# Patient Record
Sex: Female | Born: 1935 | Race: White | Hispanic: No | Marital: Married | Smoking: Never smoker
Health system: Southern US, Community
[De-identification: ages and names within clinical notes are randomized; demographics above are authoritative.]

## PROBLEM LIST (undated history)

## (undated) DIAGNOSIS — I10 Essential (primary) hypertension: Secondary | ICD-10-CM

## (undated) DIAGNOSIS — M5416 Radiculopathy, lumbar region: Secondary | ICD-10-CM

## (undated) DIAGNOSIS — F419 Anxiety disorder, unspecified: Secondary | ICD-10-CM

## (undated) DIAGNOSIS — E785 Hyperlipidemia, unspecified: Secondary | ICD-10-CM

## (undated) DIAGNOSIS — G2581 Restless legs syndrome: Secondary | ICD-10-CM

## (undated) DIAGNOSIS — G20 Parkinson's disease: Secondary | ICD-10-CM

## (undated) DIAGNOSIS — G894 Chronic pain syndrome: Secondary | ICD-10-CM

## (undated) DIAGNOSIS — G20A1 Parkinson's disease without dyskinesia, without mention of fluctuations: Secondary | ICD-10-CM

## (undated) DIAGNOSIS — N189 Chronic kidney disease, unspecified: Secondary | ICD-10-CM

## (undated) DIAGNOSIS — E119 Type 2 diabetes mellitus without complications: Secondary | ICD-10-CM

## (undated) DIAGNOSIS — I351 Nonrheumatic aortic (valve) insufficiency: Secondary | ICD-10-CM

## (undated) DIAGNOSIS — I639 Cerebral infarction, unspecified: Secondary | ICD-10-CM

## (undated) HISTORY — PX: ANTERIOR AND POSTERIOR REPAIR: SHX1172

## (undated) HISTORY — DX: Restless legs syndrome: G25.81

## (undated) HISTORY — DX: Radiculopathy, lumbar region: M54.16

## (undated) HISTORY — DX: Type 2 diabetes mellitus without complications: E11.9

## (undated) HISTORY — DX: Parkinson's disease: G20

## (undated) HISTORY — DX: Chronic kidney disease, unspecified: N18.9

## (undated) HISTORY — DX: Anxiety disorder, unspecified: F41.9

## (undated) HISTORY — DX: Nonrheumatic aortic (valve) insufficiency: I35.1

## (undated) HISTORY — DX: Hyperlipidemia, unspecified: E78.5

## (undated) HISTORY — PX: TUBAL LIGATION: SHX77

## (undated) HISTORY — DX: Chronic pain syndrome: G89.4

## (undated) HISTORY — DX: Parkinson's disease without dyskinesia, without mention of fluctuations: G20.A1

## (undated) HISTORY — DX: Essential (primary) hypertension: I10

## (undated) HISTORY — PX: UROFLOWMETRY SIMPLE / COMPLEX: SUR1429

## (undated) HISTORY — PX: CHOLECYSTECTOMY: SHX55

## (undated) HISTORY — DX: Cerebral infarction, unspecified: I63.9

## (undated) HISTORY — PX: BLADDER SURGERY: SHX569

## (undated) HISTORY — PX: OVARIAN CYST REMOVAL: SHX89

---

## 1999-09-17 ENCOUNTER — Encounter: Payer: Self-pay | Admitting: Urology

## 1999-09-26 ENCOUNTER — Ambulatory Visit (HOSPITAL_COMMUNITY): Admission: RE | Admit: 1999-09-26 | Discharge: 1999-09-26 | Payer: Self-pay | Admitting: Urology

## 2003-06-26 ENCOUNTER — Ambulatory Visit (HOSPITAL_BASED_OUTPATIENT_CLINIC_OR_DEPARTMENT_OTHER): Admission: RE | Admit: 2003-06-26 | Discharge: 2003-06-26 | Payer: Self-pay | Admitting: Urology

## 2004-05-06 ENCOUNTER — Observation Stay (HOSPITAL_COMMUNITY): Admission: RE | Admit: 2004-05-06 | Discharge: 2004-05-07 | Payer: Self-pay | Admitting: Urology

## 2005-06-19 ENCOUNTER — Ambulatory Visit (HOSPITAL_COMMUNITY): Admission: RE | Admit: 2005-06-19 | Discharge: 2005-06-19 | Payer: Self-pay | Admitting: Urology

## 2005-06-19 ENCOUNTER — Ambulatory Visit (HOSPITAL_BASED_OUTPATIENT_CLINIC_OR_DEPARTMENT_OTHER): Admission: RE | Admit: 2005-06-19 | Discharge: 2005-06-19 | Payer: Self-pay | Admitting: Urology

## 2005-10-19 ENCOUNTER — Ambulatory Visit (HOSPITAL_COMMUNITY): Admission: RE | Admit: 2005-10-19 | Discharge: 2005-10-19 | Payer: Self-pay | Admitting: Urology

## 2007-03-01 ENCOUNTER — Inpatient Hospital Stay (HOSPITAL_COMMUNITY): Admission: RE | Admit: 2007-03-01 | Discharge: 2007-03-07 | Payer: Self-pay | Admitting: Urology

## 2007-03-09 ENCOUNTER — Inpatient Hospital Stay (HOSPITAL_COMMUNITY): Admission: EM | Admit: 2007-03-09 | Discharge: 2007-03-25 | Payer: Self-pay | Admitting: Emergency Medicine

## 2008-10-05 IMAGING — CR DG CHEST 2V
2 series · 2 of 2 positions shown · non-contrast
Comparison: 05/05/04.

CLINICAL DATA: 71 year old; end-stage interstitial cystitis.  Preoperative respiratory exam. 
 CHEST - 2 VIEW - 02/24/07:

[view not recorded (1 of 2)]
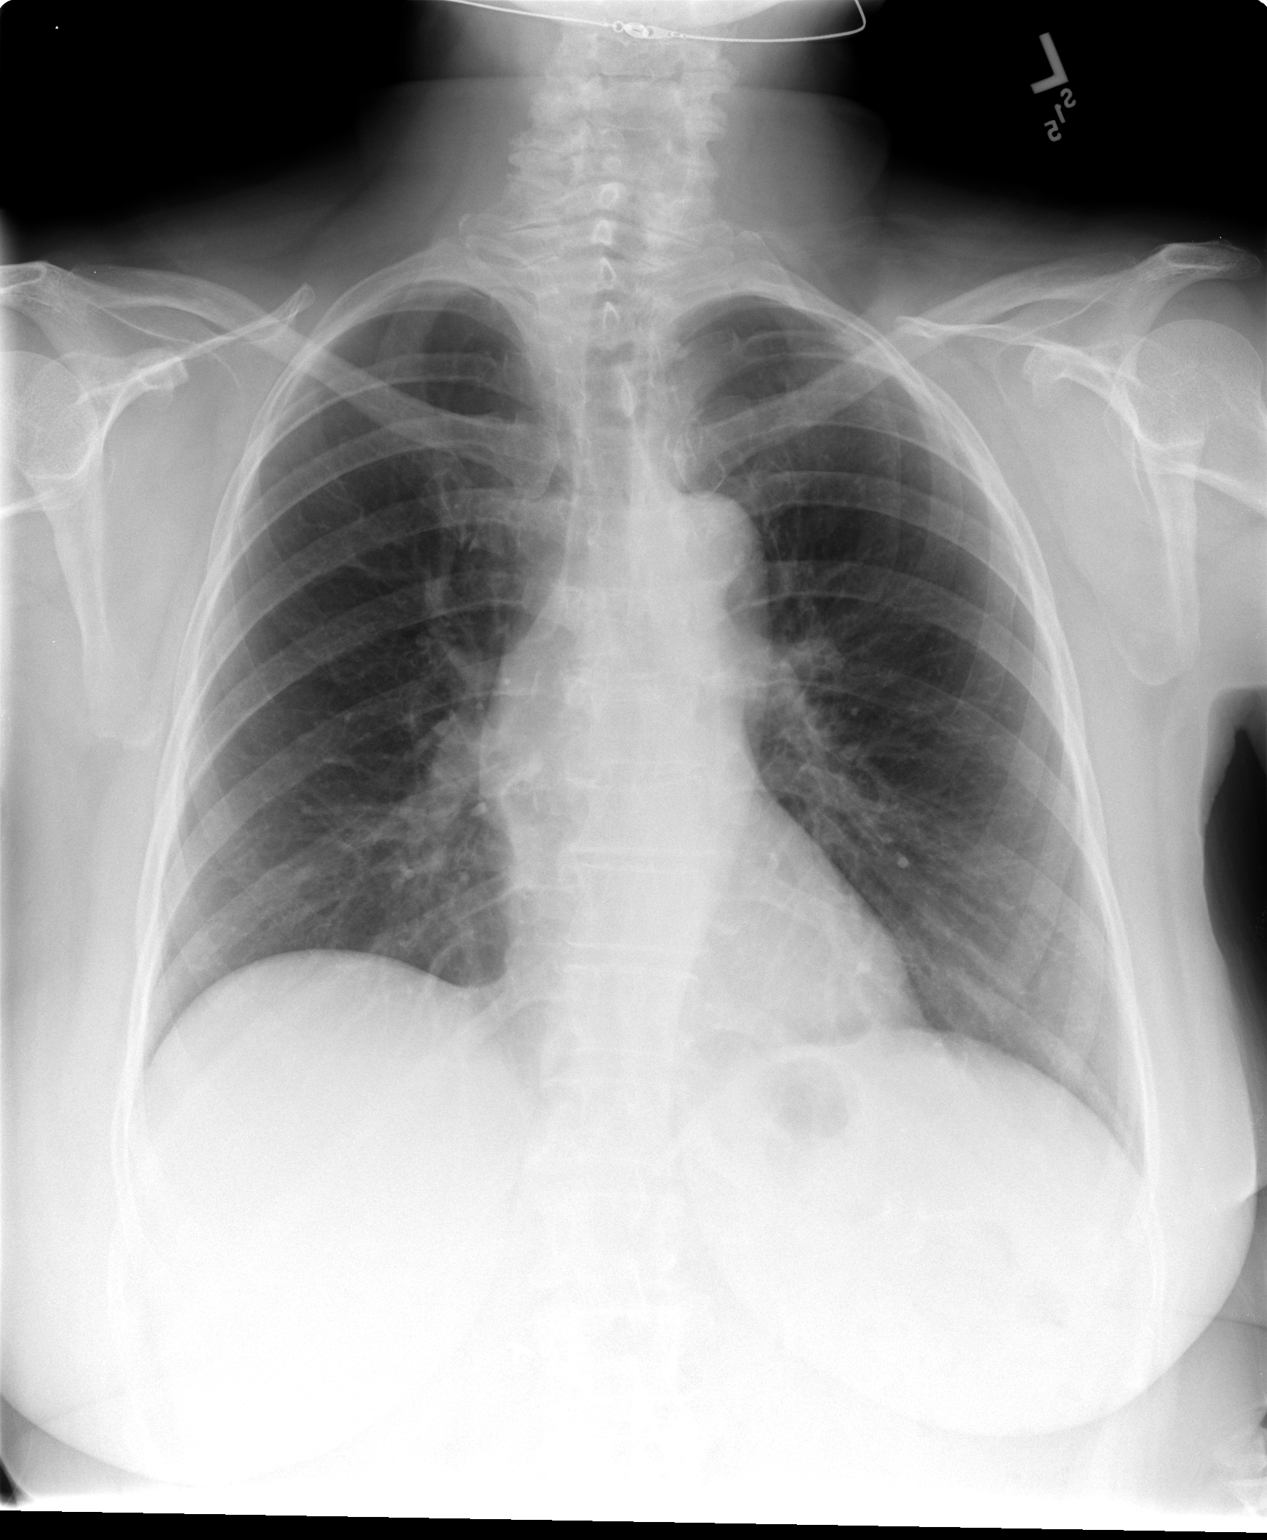

[view not recorded (2 of 2)]
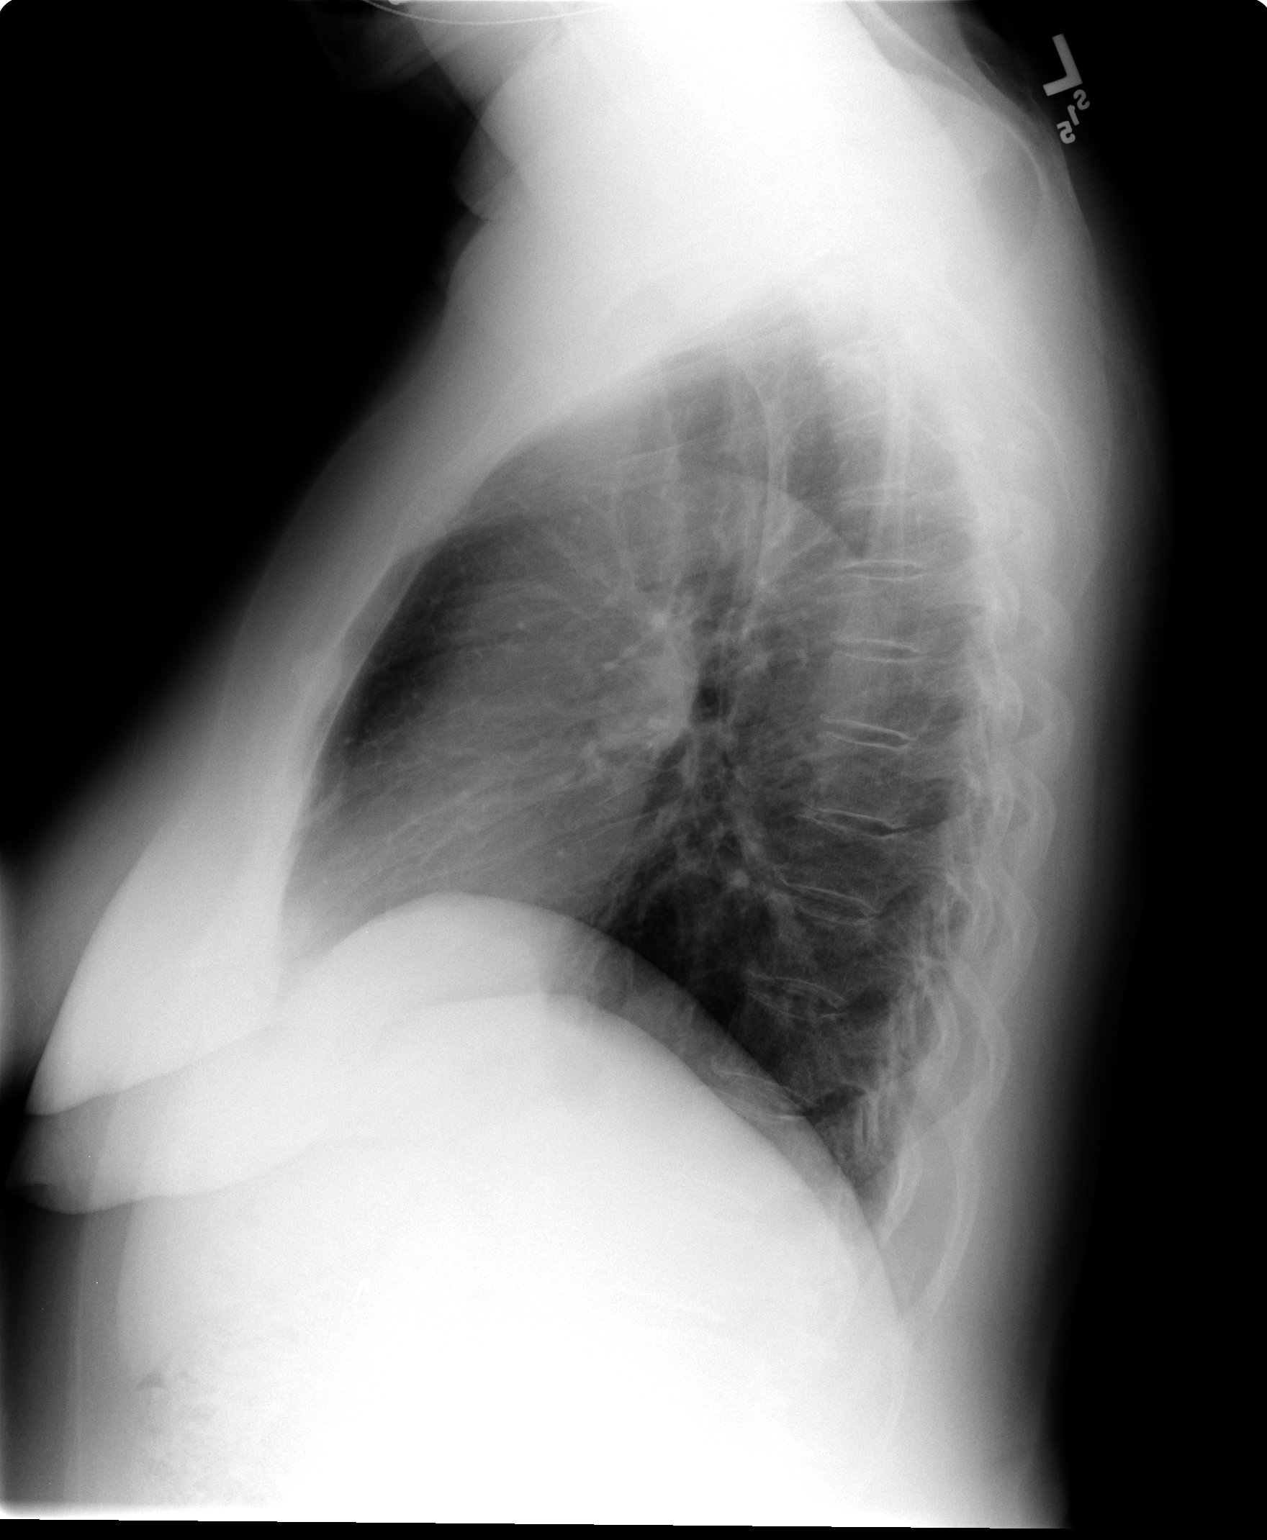

[2 of 2 positions shown; findings below may reference images not displayed]

FINDINGS: The cardiac silhouette, mediastinal and hilar contours are within normal limits and stable.  There is a slightly prominent right hilum that I think is due to tortuosity of the ascending aorta and also SVC.  The lungs are clear.   The bony structures are intact.
IMPRESSION: No acute cardiopulmonary findings.   Stable appearance of the chest since 05/05/04.

## 2011-01-17 ENCOUNTER — Encounter: Payer: Self-pay | Admitting: Urology

## 2017-09-03 ENCOUNTER — Encounter: Payer: Self-pay | Admitting: Neurology

## 2017-09-22 ENCOUNTER — Encounter: Payer: Self-pay | Admitting: Neurology

## 2017-09-24 NOTE — Progress Notes (Signed)
Claudia Sanders was seen today in the movement disorders clinic for neurologic consultation at the request of Myrtis Hopping., MD.  The consultation is for the evaluation of PD.  The records that were made available to me were reviewed.  She has been taken care of neurologically by Dr. Barnett Hatter in [facility].  She was last seen on 08/31/17.  At that visit her pramipexole 7m tid was d/c and she was started on carbidopa/levodopa 25/100 tid.  She ended up seeing her PCP and told him she wanted to d/c the carbidopa/levodopa and restart pramipexole.   She is still on carbidopa/levodopa 25/100 tid.  She states that she heard that the pramipexole and carbidopa/levodopa 25/100 work best together and she really would like to consider this.     Most of patients neuro records are focused on back pain, ESI and not on PD management.  Pt is unsure of when she was dx.  She initially states that she was dx 6 years ago and then states 2 years ago.   States that her 542y/o son was dx with PD and she noted that small handwriting was a sx.  She also noted drooling and balance issues.   Specific Symptoms:  Tremor: usually not Family hx of similar:  Yes, son with PD Voice: softer  Sleep: sleeps pretty well  Vivid Dreams:  No.  Acting out dreams:  Yes.  , just starting to fight with arms Wet Pillows: No. Postural symptoms:  Yes.    Falls?  Yes.   fx pelvis in 2017 after a fall Bradykinesia symptoms: difficulty getting out of a chair and drags R leg; drools some during the day Loss of smell:  Maybe a little Loss of taste:  No. Urinary Incontinence:  Has urostomy due to IC Difficulty Swallowing:  No. Handwriting, micrographia: Yes.   Trouble with ADL's:  No. (rare trouble with bra)  Trouble buttoning clothing: No. Depression:  No. Memory changes:  Yes.  , minor trouble with short term memory and some trouble with word finding; she drives minimally (one time in the last 2 years) Hallucinations:  No.  visual  distortions: No. N/V:  No. Lightheaded:  Yes.  , rarely  Syncope: No. Diplopia:  No., but has blurry vision even with new glasses Dyskinesia:  No.  Neuroimaging of the brain has previously been performed.  It is not available for my review today.  She was told that she previously had a stroke that she didn't know about  PREVIOUS MEDICATIONS: Sinemet and Mirapex  ALLERGIES:   Allergies  Allergen Reactions  . Amlodipine   . Buspar [Buspirone]   . Ciprofloxacin   . Estradiol   . Fosamax [Alendronate Sodium]   . Iodine   . Losartan   . Metoprolol   . Penicillins   . Rofecoxib   . Tobrex [Tobramycin]     CURRENT MEDICATIONS:  Outpatient Encounter Prescriptions as of 09/28/2017  Medication Sig  . ALPRAZolam (XANAX) 1 MG tablet Take 1 mg by mouth 2 (two) times daily as needed for anxiety.   .Marland Kitchenatenolol (TENORMIN) 50 MG tablet Take 50 mg by mouth daily.  . carbidopa-levodopa (SINEMET IR) 25-100 MG tablet Take 1 tablet by mouth 3 (three) times daily.  . cholecalciferol (VITAMIN D) 1000 units tablet Take 1,000 Units by mouth daily.  . clobetasol cream (TEMOVATE) 05.68% Apply 1 application topically 2 (two) times daily.  . Cranberry 500 MG CAPS Take by mouth.  .Marland Kitchen  erythromycin ophthalmic ointment 1 application at bedtime.  . furosemide (LASIX) 20 MG tablet Take 20 mg by mouth as needed.  . gabapentin (NEURONTIN) 100 MG capsule Take 400 mg by mouth at bedtime.   Marland Kitchen glucosamine-chondroitin 500-400 MG tablet Take 1 tablet by mouth 3 (three) times daily.  . hydrocortisone 2.5 % cream Apply topically 2 (two) times daily.  Marland Kitchen ketoconazole (NIZORAL) 2 % shampoo Apply 1 application topically 2 (two) times a week.  Marland Kitchen LACTOBACILLUS PO Take by mouth.  . Sodium Fluoride (PREVIDENT 5000 BOOSTER) 1.1 % PSTE Place onto teeth.   No facility-administered encounter medications on file as of 09/28/2017.     PAST MEDICAL HISTORY:   Past Medical History:  Diagnosis Date  . Anxiety   . Aortic valve  insufficiency   . Benign essential hypertension   . Chronic kidney disease   . Chronic pain syndrome   . CVA (cerebral vascular accident) ([place])   . Hyperlipidemia   . Lumbar radiculopathy   . Parkinson's disease ([place])   . RLS (restless legs syndrome)   . Type 2 diabetes mellitus ([place])     PAST SURGICAL HISTORY:   Past Surgical History:  Procedure Laterality Date  . ANTERIOR AND POSTERIOR REPAIR    . CHOLECYSTECTOMY    . OVARIAN CYST REMOVAL    . TUBAL LIGATION    . UROFLOWMETRY SIMPLE / COMPLEX      SOCIAL HISTORY:   Social History   Social History  . Marital status: Married    Spouse name: N/A  . Number of children: N/A  . Years of education: N/A   Occupational History  . Not on file.   Social History Main Topics  . Smoking status: Never Smoker  . Smokeless tobacco: Never Used  . Alcohol use No  . Drug use: No  . Sexual activity: Not on file   Other Topics Concern  . Not on file   Social History Narrative  . No narrative on file    FAMILY HISTORY:   Family Status  Relation Status  . Mother Deceased  . Father Deceased  . Brother Alive  . Brother Alive  . Brother Deceased  . Son Alive    ROS:  A complete 10 system review of systems was obtained and was unremarkable apart from what is mentioned above.  PHYSICAL EXAMINATION:    VITALS:   Vitals:   09/28/17 0931  BP: 128/82  Pulse: 66  SpO2: 96%  Weight: 131 lb (59.4 kg)  Height: 4' 10"  (1.473 m)    GEN:  The patient appears stated age and is in NAD. HEENT:  Normocephalic, atraumatic.  The mucous membranes are moist. The superficial temporal arteries are without ropiness or tenderness. CV:  RRR Lungs:  CTAB Neck/HEME:  There are no carotid bruits bilaterally.  Neurological examination:  Orientation: The patient is alert and oriented x3. Fund of knowledge is appropriate.  Recent and remote memory are intact.  Attention and concentration are normal.    Able to name objects and repeat  phrases. Cranial nerves: There is good facial symmetry.  There is facial hypomimia.  Pupils are equal round and reactive to light bilaterally. Fundoscopic exam reveals clear margins bilaterally. Extraocular muscles are intact. She has mild trouble with upgaze.  She has occasional square wave jerks.  The visual fields are full to confrontational testing. The speech is fluent and clear.  She is hypophonic.  Soft palate rises symmetrically and there is no tongue deviation. Hearing  is intact to conversational tone. Sensation: Sensation is intact to light and pinprick throughout (facial, trunk, extremities). Vibration is intact at the bilateral big toe but slightly decreased. There is no extinction with double simultaneous stimulation. There is no sensory dermatomal level identified. Motor: Strength is 5/5 in the bilateral upper and lower extremities.   Shoulder shrug is equal and symmetric.  There is no pronator drift. Deep tendon reflexes: Deep tendon reflexes are 2/4 at the bilateral biceps, triceps, brachioradialis, 2+ at the bilateral patella and achilles. Plantar responses are downgoing bilaterally.  Movement examination: Tone: There is mild to mod increased tone in the RUE.  There is mild increased tone in the RLE.  There is normal tone in the LUE/LLE.   Abnormal movements: none even with distraction Coordination:  There is decremation with RAM's, with any form of RAMS, including alternating supination and pronation of the forearm, hand opening and closing, finger taps, heel taps and toe taps, R more than L Gait and Station: The patient has no difficulty arising out of a deep-seated chair without the use of the hands. The patient's stride length is slightly decreased with poor arm swing bilaterally.  The patient has a positive pull test.      ASSESSMENT/PLAN:  1.  Parkinsonism  -This is likely idiopathic Parkinson's disease, although I do not think that PSP can completely be excluded.  She was just  started on levodopa and finds this to be at least 25% beneficial.  I am going to increase the dosage to carbidopa/levodopa 25/100, 1.5 tablets 3 times per day.  We discussed potential interactions with protein.  Risks, benefits, side effects and alternative therapies were discussed.  The opportunity to ask questions was given and they were answered to the best of my ability.  The patient expressed understanding and willingness to follow the outlined treatment protocols.  -wants to be on pramipexole but I would not recommend given hx of dialysis in the past and chronic renal insufficiency.  This is also risky given age and I would not add this. Parkinsonism.  I suspect that this does represent idiopathic Parkinson's disease.  The patient has tremor, bradykinesia, rigidity and mild postural instability.  -We discussed the diagnosis as well as pathophysiology of the disease.  We discussed treatment options as well as prognostic indicators.  Patient education was provided.  -We discussed that it used to be thought that levodopa would increase risk of melanoma but now it is believed that Parkinsons itself likely increases risk of melanoma. she is to get regular skin checks.  -We discussed community resources in the area including patient support groups and community exercise programs for PD and pt education was provided to the patient.  -She met with our Parkinson's social worker today and was encouraged to utilize that resource.  2.  Sialorrhea  -This is commonly associated with PD.  We talked about treatments.  The patient is not a candidate for oral anticholinergic therapy because of increased risk of confusion and falls.  We discussed myobloc and 1% atropine drops.  We discusssed that candy like lemon drops (sugar free) can help by stimulating mm of the oropharynx to induce swallowing.  She is going to think about the options.    3.  Chronic low back pain  -She has been on pain medication and continues to  be on pain medication.  This was previously followed by her neurologist and was receiving epidural steroid injections.  This is not something that our office provides.  4.  She will follow-up with me in the next 3-4 months, sooner should new neurologic issues arise.   Much greater than 50% of this visit was spent in counseling and coordinating care.  Total face to face time:  60 min   Cc:  Myrtis Hopping., MD

## 2017-09-28 ENCOUNTER — Encounter: Payer: Self-pay | Admitting: Psychology

## 2017-09-28 ENCOUNTER — Encounter: Payer: Self-pay | Admitting: Neurology

## 2017-09-28 ENCOUNTER — Ambulatory Visit (INDEPENDENT_AMBULATORY_CARE_PROVIDER_SITE_OTHER): Payer: Medicare Other | Admitting: Neurology

## 2017-09-28 VITALS — BP 128/82 | HR 66 | Ht <= 58 in | Wt 131.0 lb

## 2017-09-28 DIAGNOSIS — K117 Disturbances of salivary secretion: Secondary | ICD-10-CM

## 2017-09-28 DIAGNOSIS — G20 Parkinson's disease: Secondary | ICD-10-CM | POA: Diagnosis not present

## 2017-09-28 DIAGNOSIS — G8929 Other chronic pain: Secondary | ICD-10-CM

## 2017-09-28 DIAGNOSIS — M545 Low back pain: Secondary | ICD-10-CM | POA: Diagnosis not present

## 2017-09-28 MED ORDER — CARBIDOPA-LEVODOPA 25-100 MG PO TABS: 1.5000 | ORAL_TABLET | Freq: Three times a day (TID) | ORAL | 1 refills | Status: DC

## 2017-09-28 NOTE — Progress Notes (Signed)
I met with Claudia Sanders and her husband while they were in clinic today. After their visit with Dr. Carles Collet I provided information on local resources for patients living with Parkinson's disease including exercise, wellness and community support programs in addition to my contact information.

## 2017-09-28 NOTE — Patient Instructions (Signed)
1. Increase Levodopa to 1.5 tablets three times daily.

## 2018-02-01 NOTE — Progress Notes (Signed)
Claudia Sanders was seen today in the movement disorders clinic for neurologic consultation at the request of Dan Maker., MD.  The consultation is for the evaluation of PD.  The records that were made available to me were reviewed.  She has been taken care of neurologically by Dr. Rosario Jacks in [facility].  She was last seen on 08/31/17.  At that visit her pramipexole 1mg  tid was d/c and she was started on carbidopa/levodopa 25/100 tid.  She ended up seeing her PCP and told him she wanted to d/c the carbidopa/levodopa and restart pramipexole.   She is still on carbidopa/levodopa 25/100 tid.  She states that she heard that the pramipexole and carbidopa/levodopa 25/100 work best together and she really would like to consider this.     Most of patients neuro records are focused on back pain, ESI and not on PD management.  Pt is unsure of when she was dx.  She initially states that she was dx 6 years ago and then states 2 years ago.   States that her 45 y/o son was dx with PD and she noted that small handwriting was a sx.  She also noted drooling and balance issues.   Neuroimaging of the brain has previously been performed.  It is not available for my review today.  She was told that she previously had a stroke that she didn't know about  02/03/18 update: Patient is seen today in follow-up. This patient is accompanied in the office by her spouse who supplements the history.  her levodopa was increased last visit so that she is on carbidopa/levodopa 25/100, 1.5 tablets 3 times per day.   Her only c/o inner tremor.  States that it may be when wearing off.  Will take 1/2 xanax to help.  Does this bid.   Has some c/o that R leg draws and toes may cramp.  Her husband reports that she has always had RLS.  Pt denies falls.  Notes that much better since riding bicycle and people comment on how well she looks.  Riding recumbant bike at home.  Stopped taking gabapentin due to vivid dreams.  back pain is worse and  asks if she should restart.  Having fatigue and doesn't think med related.  She takes a lengthy nap during the day and then gets up with trouble sleeping at night.  Pt denies lightheadedness, near syncope.  No hallucinations.  Mood has been good.  The records that were made available to me were reviewed via care everywhere.  PREVIOUS MEDICATIONS: Sinemet and Mirapex  ALLERGIES:   Allergies  Allergen Reactions  . Amlodipine   . Buspar [Buspirone]   . Ciprofloxacin   . Estradiol   . Fosamax [Alendronate Sodium]   . Iodine   . Losartan   . Metoprolol   . Penicillins   . Rofecoxib   . Tobrex [Tobramycin]     CURRENT MEDICATIONS:  Outpatient Encounter Medications as of 02/03/2018  Medication Sig  . ALPRAZolam (XANAX) 1 MG tablet Take 1 mg by mouth 2 (two) times daily as needed for anxiety.   Marland Kitchen atenolol (TENORMIN) 50 MG tablet Take 50 mg by mouth daily.  . carbidopa-levodopa (SINEMET IR) 25-100 MG tablet Take 1.5 tablets by mouth 3 (three) times daily.  . cholecalciferol (VITAMIN D) 1000 units tablet Take 1,000 Units by mouth daily.  . clobetasol cream (TEMOVATE) 0.05 % Apply 1 application topically 2 (two) times daily.  . Cranberry 500 MG CAPS  Take by mouth.  . erythromycin ophthalmic ointment 1 application at bedtime.  . furosemide (LASIX) 20 MG tablet Take 20 mg by mouth as needed.  . gabapentin (NEURONTIN) 100 MG capsule Take 400 mg by mouth at bedtime.   Marland Kitchen glucosamine-chondroitin 500-400 MG tablet Take 1 tablet by mouth 3 (three) times daily.  . hydrocortisone 2.5 % cream Apply topically 2 (two) times daily.  Marland Kitchen ketoconazole (NIZORAL) 2 % shampoo Apply 1 application topically 2 (two) times a week.  Marland Kitchen LACTOBACILLUS PO Take by mouth.  . Sodium Fluoride (PREVIDENT 5000 BOOSTER) 1.1 % PSTE Place onto teeth.   No facility-administered encounter medications on file as of 02/03/2018.     PAST MEDICAL HISTORY:   Past Medical History:  Diagnosis Date  . Anxiety   . Aortic valve  insufficiency   . Benign essential hypertension   . Chronic kidney disease   . Chronic pain syndrome    chronic back pain  . CVA (cerebral vascular accident) ([facility])   . Hyperlipidemia   . Lumbar radiculopathy   . Parkinson's disease ([facility])   . RLS (restless legs syndrome)   . Type 2 diabetes mellitus ([facility])     PAST SURGICAL HISTORY:   Past Surgical History:  Procedure Laterality Date  . ANTERIOR AND POSTERIOR REPAIR    . BLADDER SURGERY     urostomy  . CHOLECYSTECTOMY    . OVARIAN CYST REMOVAL    . TUBAL LIGATION    . UROFLOWMETRY SIMPLE / COMPLEX      SOCIAL HISTORY:   Social History   Socioeconomic History  . Marital status: Married    Spouse name: Not on file  . Number of children: Not on file  . Years of education: Not on file  . Highest education level: Not on file  Social Needs  . Financial resource strain: Not on file  . Food insecurity - worry: Not on file  . Food insecurity - inability: Not on file  . Transportation needs - medical: Not on file  . Transportation needs - non-medical: Not on file  Occupational History  . Occupation: retired    Comment: Engineer, civil (consulting)  Tobacco Use  . Smoking status: Never Smoker  . Smokeless tobacco: Never Used  Substance and Sexual Activity  . Alcohol use: No  . Drug use: No  . Sexual activity: Not on file  Other Topics Concern  . Not on file  Social History Narrative  . Not on file    FAMILY HISTORY:   Family Status  Relation Name Status  . Mother  Deceased  . Father  Deceased  . Brother  Alive  . Brother  Alive  . Son  Alive  . Brother  Alive  . Son  Alive  . Son  Alive    ROS:  A complete 10 system review of systems was obtained and was unremarkable apart from what is mentioned above.  PHYSICAL EXAMINATION:    VITALS:   There were no vitals filed for this visit.  GEN:  The patient appears stated age and is in NAD. HEENT:  Normocephalic, atraumatic.  The mucous membranes are moist. The superficial temporal  arteries are without ropiness or tenderness. CV:  RRR Lungs:  CTAB Neck/HEME:  There are no carotid bruits bilaterally.  Neurological examination:  Orientation: The patient is alert and oriented x3. Fund of knowledge is appropriate.  Recent and remote memory are intact.  Attention and concentration are normal.    Able to name objects  and repeat phrases. Cranial nerves: There is good facial symmetry.  There is facial hypomimia.  Pupils are equal round and reactive to light bilaterally. Fundoscopic exam reveals clear margins bilaterally. Extraocular muscles are intact. She has mild trouble with upgaze.  She has occasional square wave jerks.  The visual fields are full to confrontational testing. The speech is fluent and clear.  She is hypophonic.  Soft palate rises symmetrically and there is no tongue deviation. Hearing is intact to conversational tone. Sensation: Sensation is intact to light and pinprick throughout (facial, trunk, extremities). Vibration is intact at the bilateral big toe but slightly decreased. There is no extinction with double simultaneous stimulation. There is no sensory dermatomal level identified. Motor: Strength is 5/5 in the bilateral upper and lower extremities.   Shoulder shrug is equal and symmetric.  There is no pronator drift.  Movement examination: Tone: There is mild increased tone in the RUE and mild to mod in the RLE Coordination:  There is decremation with RAM's, with any form of RAMS, including alternating supination and pronation of the forearm, hand opening and closing, finger taps, heel taps and toe taps, R more than L (same as last visit) Gait and Station: The patient has no difficulty arising out of a deep-seated chair without the use of the hands. The patient's stride length is slightly decreased with poor arm swing bilaterally.  The patient has a positive pull test.      Labs:  Had labs on 02/01/18 and patient brought them today.  Her A1C is 6.2.  TSH is  3.394. Chemistry was normal as was CBC  ASSESSMENT/PLAN:  1.  Parkinsonism  -This is likely idiopathic Parkinson's disease, although I do not think that PSP can completely be excluded.  She was just started on levodopa and finds this to be at least 25% beneficial.  I am going to increase the dosage to carbidopa/levodopa 25/100, 2 po times per day.  If not helpful for rigidity may need to change to qid dosing.  - will try an extra 1/2 tablet dissolved in ginger ale when has inner tremor to see if helpful.  Try this instead of xanax.  If helpful, may need to adjust the dosing.  -wants to be on pramipexole but I would not recommend given hx of dialysis in the past and chronic renal insufficiency.  This is also risky given age and I would not add this.  -talked to her about regular daily schedule and napping only when scheduled, no longer than 60 min.    -We discussed the diagnosis as well as pathophysiology of the disease.  We discussed treatment options as well as prognostic indicators.  Patient education was provided.  -We discussed that it used to be thought that levodopa would increase risk of melanoma but now it is believed that Parkinsons itself likely increases risk of melanoma. she is to get regular skin checks.  -We discussed community resources in the area including patient support groups and community exercise programs for PD and pt education was provided to the patient.  2.  Sialorrhea  -This is commonly associated with PD.  We talked about treatments.  The patient is not a candidate for oral anticholinergic therapy because of increased risk of confusion and falls.  We discussed myobloc and 1% atropine drops.  We discusssed that candy like lemon drops (sugar free) can help by stimulating mm of the oropharynx to induce swallowing.  She is going to think about the options.  3.  Chronic low back pain  -She has been on pain medication and continues to be on pain medication.  This was  previously followed by her neurologist and was receiving epidural steroid injections.  This is not something that our office provides.  However, she did take herself off of gabapentin due to dreams and back pain is worse.  Explained that vivid dreams are part of PD and likely not related to gabapentin.  She wants to go back on it and I have no objection.  4.  chronic kidney disease  -has appointment tomorrow with echo and renal u/s due to hard to control HTN to r/o RAS  5.  Follow up is anticipated in the next few months, sooner should new neurologic issues arise.  Much greater than 50% of this visit was spent in counseling and coordinating care.  Total face to face time:  30 min   Cc:  Dan Maker., MD

## 2018-02-03 ENCOUNTER — Ambulatory Visit (INDEPENDENT_AMBULATORY_CARE_PROVIDER_SITE_OTHER): Payer: Medicare Other | Admitting: Neurology

## 2018-02-03 ENCOUNTER — Encounter: Payer: Self-pay | Admitting: Neurology

## 2018-02-03 ENCOUNTER — Encounter: Payer: Self-pay | Admitting: Psychology

## 2018-02-03 VITALS — BP 130/78 | HR 66 | Ht <= 58 in | Wt 130.0 lb

## 2018-02-03 DIAGNOSIS — G20 Parkinson's disease: Secondary | ICD-10-CM | POA: Diagnosis not present

## 2018-02-03 DIAGNOSIS — R5383 Other fatigue: Secondary | ICD-10-CM

## 2018-02-03 DIAGNOSIS — M545 Low back pain: Secondary | ICD-10-CM

## 2018-02-03 DIAGNOSIS — G8929 Other chronic pain: Secondary | ICD-10-CM | POA: Diagnosis not present

## 2018-02-03 NOTE — Patient Instructions (Signed)
Increase Carbidopa Levodopa to 2 tablets three times daily.

## 2018-02-03 NOTE — Progress Notes (Signed)
Met with the patient and her husband today while they were in the clinic.  They are very interested in having Parkinson's resources in the [place] community and would like to participate when the support group is open.  In addition they are very interested in exercise for Parkinson's in the [facility] area.  I would let them know as soon as the group has a finalized location.

## 2018-02-10 ENCOUNTER — Telehealth: Payer: Self-pay | Admitting: Neurology

## 2018-02-10 NOTE — Telephone Encounter (Signed)
Pt left a VM message regarding the increase of her carbadopa and would like a call back

## 2018-02-11 NOTE — Telephone Encounter (Signed)
Tried to call x2 and it states number is not in service. Will await call back.

## 2018-03-09 ENCOUNTER — Telehealth: Payer: Self-pay | Admitting: Neurology

## 2018-03-09 MED ORDER — CARBIDOPA-LEVODOPA 25-100 MG PO TABS: 1.5000 | ORAL_TABLET | Freq: Three times a day (TID) | ORAL | 1 refills | Status: DC

## 2018-03-09 NOTE — Telephone Encounter (Signed)
Increased dose sent to [facility].

## 2018-03-09 NOTE — Telephone Encounter (Signed)
Patient called regarding her Carbidopa Levodopa medication 1.5 3 x a day pill pack. She said an Extra .5 could be taken as needed? She said [facility] our office. She said the [facility] she can get it. She is running out. Thanks

## 2018-03-10 ENCOUNTER — Other Ambulatory Visit: Payer: Self-pay | Admitting: Neurology

## 2018-03-10 MED ORDER — CARBIDOPA-LEVODOPA 25-100 MG PO TABS: 1.5000 | ORAL_TABLET | Freq: Three times a day (TID) | ORAL | 1 refills | Status: DC

## 2018-03-10 NOTE — Telephone Encounter (Signed)
Prescription [facility] to [facility] after request received.

## 2018-06-02 NOTE — Progress Notes (Signed)
Claudia Sanders was seen today in the movement disorders clinic for neurologic consultation at the request of [name], [name], MD.  The consultation is for the evaluation of PD.  The records that were made available to me were reviewed.  She has been taken care of neurologically by Dr. [name] in [facility].  She was last seen on 08/31/17.  At that visit her pramipexole 1mg  tid was d/c and she was started on carbidopa/levodopa 25/100 tid.  She ended up seeing her PCP and told him she wanted to d/c the carbidopa/levodopa and restart pramipexole.   She is still on carbidopa/levodopa 25/100 tid.  She states that she heard that the pramipexole and carbidopa/levodopa 25/100 work best together and she really would like to consider this.     Most of patients neuro records are focused on back pain, ESI and not on PD management.  Pt is unsure of when she was dx.  She initially states that she was dx 6 years ago and then states 2 years ago.   States that her 81 y/o son was dx with PD and she noted that small handwriting was a sx.  She also noted drooling and balance issues.   Neuroimaging of the brain has previously been performed.  It is not available for my review today.  She was told that she previously had a stroke that she didn't know about  02/03/18 update: Patient is seen today in follow-up. This patient is accompanied in the office by her spouse who supplements the history.  her levodopa was increased last visit so that she is on carbidopa/levodopa 25/100, 1.5 tablets 3 times per day.   Her only c/o inner tremor.  States that it may be when wearing off.  Will take 1/2 xanax to help.  Does this bid.   Has some c/o that R leg draws and toes may cramp.  Her husband reports that she has always had RLS.  Pt denies falls.  Notes that much better since riding bicycle and people comment on how well she looks.  Riding recumbant bike at home.  Stopped taking gabapentin due to vivid dreams.  back pain is worse and  asks if she should restart.  Having fatigue and doesn't think med related.  She takes a lengthy nap during the day and then gets up with trouble sleeping at night.  Pt denies lightheadedness, near syncope.  No hallucinations.  Mood has been good.  The records that were made available to me were reviewed via care everywhere.  06/06/18 update: Patient is seen today in follow-up for parkinsonism.  Patient is on carbidopa/levodopa 25/100, which was increased to 2 tablets 3 times per day last visit.  she reports she is still taking 1.5 tabs tid (7:30am/1pm/6pm) because ran out of meds.   She was told to try an extra half tablet as needed for tremor instead of trying Xanax.  She does this "on occasion but not a lot."  States that she has cut down somewhat on xanax - takes 60 days to go through 60 pills even though written for bid dosing.  States that her son has PD but is on xanax and ativan and asks me about that.  Still with inner tremor and still treating that with xanax.  Biggest c/o is cramping and drawing of the right leg.  She doesn't know if it is a certain time of day.  she doesn't know if it happens after she takes carbidopa/levodopa, she  just know that it happens "a lot."  R arm cramps too but she attributes that to injury in the past.  Having word finding trouble.  Asks me about clinical trial for pill that [facility] reports could cure PD.    Records have been reviewed that are available to me.  There are very few office notes, but multiple telephone calls and an echocardiogram is noted since last visit.  PREVIOUS MEDICATIONS: Sinemet and Mirapex  ALLERGIES:   Allergies  Allergen Reactions  . Amlodipine   . Buspar [Buspirone]   . Ciprofloxacin   . Erythromycin   . Estradiol   . Fosamax [Alendronate Sodium]   . Iodine   . Losartan   . Metoprolol   . Penicillins   . Rofecoxib   . Tobrex [Tobramycin]     CURRENT MEDICATIONS:  Outpatient Encounter Medications as of 06/06/2018  Medication Sig  .  ALPRAZolam (XANAX) 1 MG tablet Take 1 mg by mouth 2 (two) times daily as needed for anxiety.   Marland Kitchen atenolol (TENORMIN) 50 MG tablet Take 50 mg by mouth daily.  Marland Kitchen b complex vitamins tablet Take 1 tablet by mouth daily.  . carbidopa-levodopa (SINEMET IR) 25-100 MG tablet Take 2 at 7am/1 at 11am/2 at 3pm/1 at 7pm  . cholecalciferol (VITAMIN D) 1000 units tablet Take 1,000 Units by mouth daily.  . clobetasol cream (TEMOVATE) 0.05 % Apply 1 application topically 2 (two) times daily.  . Cranberry 500 MG CAPS Take by mouth.  Marland Kitchen glucosamine-chondroitin 500-400 MG tablet Take 1 tablet by mouth 3 (three) times daily.  . hydrocortisone 2.5 % cream Apply topically 2 (two) times daily.  Marland Kitchen LACTOBACILLUS PO Take by mouth.  . Multiple Vitamins-Minerals (PRESERVISION AREDS 2+MULTI VIT PO) Take by mouth.  . Sodium Fluoride (PREVIDENT 5000 BOOSTER) 1.1 % PSTE Place onto teeth.  . [DISCONTINUED] carbidopa-levodopa (SINEMET IR) 25-100 MG tablet Take 1.5 tablets by mouth 3 (three) times daily. Extra .5 tablet daily PRN  . [DISCONTINUED] erythromycin ophthalmic ointment 1 application at bedtime.  . [DISCONTINUED] Flaxseed, Linseed, (FLAX SEEDS PO) Take by mouth.   No facility-administered encounter medications on file as of 06/06/2018.     PAST MEDICAL HISTORY:   Past Medical History:  Diagnosis Date  . Anxiety   . Aortic valve insufficiency   . Benign essential hypertension   . Chronic kidney disease   . Chronic pain syndrome    chronic back pain  . CVA (cerebral vascular accident) (HCC)   . Hyperlipidemia   . Lumbar radiculopathy   . Parkinson's disease (HCC)   . RLS (restless legs syndrome)   . Type 2 diabetes mellitus (HCC)     PAST SURGICAL HISTORY:   Past Surgical History:  Procedure Laterality Date  . ANTERIOR AND POSTERIOR REPAIR    . BLADDER SURGERY     urostomy  . CHOLECYSTECTOMY    . OVARIAN CYST REMOVAL    . TUBAL LIGATION    . UROFLOWMETRY SIMPLE / COMPLEX      SOCIAL HISTORY:     Social History   Socioeconomic History  . Marital status: Married    Spouse name: Not on file  . Number of children: Not on file  . Years of education: Not on file  . Highest education level: Not on file  Occupational History  . Occupation: retired    Comment: Engineer, civil (consulting)  Social Needs  . Financial resource strain: Not on file  . Food insecurity:    Worry: Not on file  Inability: Not on file  . Transportation needs:    Medical: Not on file    Non-medical: Not on file  Tobacco Use  . Smoking status: Never Smoker  . Smokeless tobacco: Never Used  Substance and Sexual Activity  . Alcohol use: No  . Drug use: No  . Sexual activity: Not on file  Lifestyle  . Physical activity:    Days per week: Not on file    Minutes per session: Not on file  . Stress: Not on file  Relationships  . Social connections:    Talks on phone: Not on file    Gets together: Not on file    Attends religious service: Not on file    Active member of club or organization: Not on file    Attends meetings of clubs or organizations: Not on file    Relationship status: Not on file  . Intimate partner violence:    Fear of current or ex partner: Not on file    Emotionally abused: Not on file    Physically abused: Not on file    Forced sexual activity: Not on file  Other Topics Concern  . Not on file  Social History Narrative  . Not on file    FAMILY HISTORY:   Family Status  Relation Name Status  . Mother  Deceased  . Father  Deceased  . Brother  Alive  . Brother  Alive  . Son  Alive  . Brother  Alive  . Son  Alive  . Son  Alive    ROS: Review of Systems  Constitutional: Positive for malaise/fatigue.  HENT: Negative.   Eyes: Negative.   Respiratory: Negative.   Cardiovascular: Negative.   Gastrointestinal: Negative.   Genitourinary: Negative.   Musculoskeletal: Negative.   Skin: Negative.   Endo/Heme/Allergies: Negative.   Psychiatric/Behavioral: Positive for memory loss.      PHYSICAL EXAMINATION:    VITALS:   Vitals:   06/06/18 1115  BP: 108/68  Pulse: 62  SpO2: 93%  Weight: 130 lb (59 kg)  Height: 4\' 10"  (1.473 m)   GEN:  The patient appears stated age and is in NAD. HEENT:  Normocephalic, atraumatic.  The mucous membranes are moist. The superficial temporal arteries are without ropiness or tenderness. CV:  RRR Lungs:  CTAB Neck/HEME:  There are no carotid bruits bilaterally.  Neurological examination:  Orientation: The patient is alert and oriented x3. Cranial nerves: There is good facial symmetry. The speech is fluent and clear. Soft palate rises symmetrically and there is no tongue deviation. Hearing is intact to conversational tone. Sensation: Sensation is intact to light touch throughout Motor: Strength is 5/5 in the bilateral upper and lower extremities.   Shoulder shrug is equal and symmetric.  There is no pronator drift.  Movement examination: Tone: There is mild increased tone in the right upper and right lower extremity.  Tone elsewhere is normal. Abnormal movements: None Coordination:  There is  decremation with RAM's, with any form of RAMS, including alternating supination and pronation of the forearm, hand opening and closing, finger taps, heel taps and toe taps, right more than left Gait and Station: The patient has no difficulty arising out of a deep-seated chair without the use of the hands. The patient's stride length is decreased with decreased arm swing bilaterally.  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    Labs:  Had labs on 02/01/18 and patient brought them today.  Her A1C is 6.2.  TSH  is 3.394. Chemistry was normal as was CBC  ASSESSMENT/PLAN:  1.  Parkinson's disease  -increase carbidopa/levodopa 25/100 from 1.5 mg tid to carbidopa/levodopa 25/100, 2/1/2/1.  She is to pay attention to the timing of cramping.    -asked me about taking xanax and ativan both as her son has PD and is on both.  Told her that this isn't  appropriate to take these medications together, but I also would like to see her off of Xanax altogether.  Discussed that this does increase risk of falls.  Discussed with her again that I want her to use levodopa for tremor control and not Xanax.  -Discussed clinical trials.  The when she was referring to is not even in phase 1 trial yet.  Gave her website information to clinical trials.gov  -talked to her about regular daily schedule and napping only when scheduled, no longer than 60 min.    -We discussed the diagnosis as well as pathophysiology of the disease.  We discussed treatment options as well as prognostic indicators.  Patient education was provided.  -We discussed that it used to be thought that levodopa would increase risk of melanoma but now it is believed that Parkinsons itself likely increases risk of melanoma. she is to get regular skin checks.  -Signed her up today for the Parkinson's symposium.  2.  Sialorrhea  -This is commonly associated with PD.  We talked about treatments.  The patient is not a candidate for oral anticholinergic therapy because of increased risk of confusion and falls.  We discussed myobloc and 1% atropine drops.  We discusssed that candy like lemon drops (sugar free) can help by stimulating mm of the oropharynx to induce swallowing.  She is going to think about the options.    3.  Chronic low back pain  -She has been on pain medication and continues to be on pain medication.  This was previously followed by her neurologist and was receiving epidural steroid injections.  This is not something that our office provides.  However, she did take herself off of gabapentin due to dreams and back pain is worse.  Explained that vivid dreams are part of PD and likely not related to gabapentin.  She wants to go back on it and I have no objection.  4.  chronic kidney disease  -son on pramipexole.  Told her that I don't want to use in her age group but it also can interfere  with renal fxn (has to be dosed renally)  5.  Follow up is anticipated in the next few months, sooner should new neurologic issues arise.  Much greater than 50% of this visit was spent in counseling and coordinating care.  Total face to face time:  25 min   Cc:  Dan Maker., MD

## 2018-06-06 ENCOUNTER — Encounter: Payer: Self-pay | Admitting: Neurology

## 2018-06-06 ENCOUNTER — Ambulatory Visit (INDEPENDENT_AMBULATORY_CARE_PROVIDER_SITE_OTHER): Payer: Medicare Other | Admitting: Neurology

## 2018-06-06 VITALS — BP 108/68 | HR 62 | Ht <= 58 in | Wt 130.0 lb

## 2018-06-06 DIAGNOSIS — G20 Parkinson's disease: Secondary | ICD-10-CM

## 2018-06-06 MED ORDER — CARBIDOPA-LEVODOPA 25-100 MG PO TABS: ORAL_TABLET | ORAL | 1 refills | Status: DC

## 2018-06-06 NOTE — Patient Instructions (Signed)
1.  You can look at clinicaltrials.gov to see where centers are recruiting for parkinsons disease. 2.  You can think about neurocognitive testing for the memory.  If you would like to schedule, call me 3.  Increase your carbidopa/levodopa 25/100, 2 tablets 7am/1 tablet at 11am/2 tablets at 3 pm/1 tablet at 7pm 4.  Pay attention to the timing of your cramping

## 2018-07-26 ENCOUNTER — Other Ambulatory Visit: Payer: Self-pay | Admitting: Neurology

## 2018-10-26 NOTE — Progress Notes (Deleted)
Claudia Sanders was seen today in the movement disorders clinic for neurologic consultation at the request of Claudia Sanders, Claudia L., MD.  The consultation is for the evaluation of PD.  The records that were made available to me were reviewed.  She has been taken care of neurologically by Dr. Rosario JacksElaine Sanders in [facility].  She was last seen on 08/31/17.  At that visit her pramipexole 1mg  tid was d/c and she was started on carbidopa/levodopa 25/100 tid.  She ended up seeing her PCP and told him she wanted to d/c the carbidopa/levodopa and restart pramipexole.   She is still on carbidopa/levodopa 25/100 tid.  She states that she heard that the pramipexole and carbidopa/levodopa 25/100 work best together and she really would like to consider this.     Most of patients neuro records are focused on back pain, ESI and not on PD management.  Pt is unsure of when she was dx.  She initially states that she was dx 6 years ago and then states 2 years ago.   States that her 81 y/o son was dx with PD and she noted that small handwriting was a sx.  She also noted drooling and balance issues.   Neuroimaging of the brain has previously been performed.  It is not available for my review today.  She was told that she previously had a stroke that she didn't know about  02/03/18 update: Patient is seen today in follow-up. This patient is accompanied in the office by her spouse who supplements the history.  her levodopa was increased last visit so that she is on carbidopa/levodopa 25/100, 1.5 tablets 3 times per day.   Her only c/o inner tremor.  States that it may be when wearing off.  Will take 1/2 xanax to help.  Does this bid.   Has some c/o that R leg draws and toes may cramp.  Her husband reports that she has always had RLS.  Pt denies falls.  Notes that much better since riding bicycle and people comment on how well she looks.  Riding recumbant bike at home.  Stopped taking gabapentin due to vivid dreams.  back pain is worse and  asks if she should restart.  Having fatigue and doesn't think med related.  She takes a lengthy nap during the day and then gets up with trouble sleeping at night.  Pt denies lightheadedness, near syncope.  No hallucinations.  Mood has been good.  The records that were made available to me were reviewed via care everywhere.  06/06/18 update: Patient is seen today in follow-up for parkinsonism.  Patient is on carbidopa/levodopa 25/100, which was increased to 2 tablets 3 times per day last visit.  she reports she is still taking 1.5 tabs tid (7:30am/1pm/6pm) because ran out of meds.   She was told to try an extra half tablet as needed for tremor instead of trying Xanax.  She does this "on occasion but not a lot."  States that she has cut down somewhat on xanax - takes 60 days to go through 60 pills even though written for bid dosing.  States that her son has PD but is on xanax and ativan and asks me about that.  Still with inner tremor and still treating that with xanax.  Biggest c/o is cramping and drawing of the right leg.  She doesn't know if it is a certain time of day.  she doesn't know if it happens after she takes carbidopa/levodopa, she  just know that it happens "a lot."  R arm cramps too but she attributes that to injury in the past.  Having word finding trouble.  Asks me about clinical trial for pill that [facility] reports could cure PD.    Records have been reviewed that are available to me.  There are very few office notes, but multiple telephone calls and an echocardiogram is noted since last visit.  10/27/18 update: Patient is seen today in follow-up for parkinsonism.  Patient is on carbidopa/levodopa 25/100 which was increased to 2/1/2/1.  I reiterated last visit that I wanted her to take levodopa for parkinsonian symptoms and not Xanax.  August primary care notes are reviewed.  At that point, patient reported that she was taking carbidopa/levodopa 25/100, 2 tablets in the morning, 1 in the afternoon and  1 in the evening.  Xanax was refilled at 1 mg twice per day.  PREVIOUS MEDICATIONS: Sinemet and Mirapex  ALLERGIES:   Allergies  Allergen Reactions  . Amlodipine   . Buspar [Buspirone]   . Ciprofloxacin   . Erythromycin   . Estradiol   . Fosamax [Alendronate Sodium]   . Iodine   . Losartan   . Metoprolol   . Penicillins   . Rofecoxib   . Tobrex [Tobramycin]     CURRENT MEDICATIONS:  Outpatient Encounter Medications as of 10/27/2018  Medication Sig  . ALPRAZolam (XANAX) 1 MG tablet Take 1 mg by mouth 2 (two) times daily as needed for anxiety.   Marland Kitchen atenolol (TENORMIN) 50 MG tablet Take 50 mg by mouth daily.  Marland Kitchen b complex vitamins tablet Take 1 tablet by mouth daily.  . carbidopa-levodopa (SINEMET IR) 25-100 MG tablet Take 2 tablets at 7 am, 1 tablet at 11 am, 2 tablets at 3 pm and 1 tablet at 7 pm.  . cholecalciferol (VITAMIN D) 1000 units tablet Take 1,000 Units by mouth daily.  . clobetasol cream (TEMOVATE) 0.05 % Apply 1 application topically 2 (two) times daily.  . Cranberry 500 MG CAPS Take by mouth.  Marland Kitchen glucosamine-chondroitin 500-400 MG tablet Take 1 tablet by mouth 3 (three) times daily.  . hydrocortisone 2.5 % cream Apply topically 2 (two) times daily.  Marland Kitchen LACTOBACILLUS PO Take by mouth.  . Multiple Vitamins-Minerals (PRESERVISION AREDS 2+MULTI VIT PO) Take by mouth.  . Sodium Fluoride (PREVIDENT 5000 BOOSTER) 1.1 % PSTE Place onto teeth.   No facility-administered encounter medications on file as of 10/27/2018.     PAST MEDICAL HISTORY:   Past Medical History:  Diagnosis Date  . Anxiety   . Aortic valve insufficiency   . Benign essential hypertension   . Chronic kidney disease   . Chronic pain syndrome    chronic back pain  . CVA (cerebral vascular accident) (HCC)   . Hyperlipidemia   . Lumbar radiculopathy   . Parkinson's disease (HCC)   . RLS (restless legs syndrome)   . Type 2 diabetes mellitus (HCC)     PAST SURGICAL HISTORY:   Past Surgical History:    Procedure Laterality Date  . ANTERIOR AND POSTERIOR REPAIR    . BLADDER SURGERY     urostomy  . CHOLECYSTECTOMY    . OVARIAN CYST REMOVAL    . TUBAL LIGATION    . UROFLOWMETRY SIMPLE / COMPLEX      SOCIAL HISTORY:   Social History   Socioeconomic History  . Marital status: Married    Spouse name: Not on file  . Number of children: Not on file  .  Years of education: Not on file  . Highest education level: Not on file  Occupational History  . Occupation: retired    Comment: Engineer, civil (consulting)  Social Needs  . Financial resource strain: Not on file  . Food insecurity:    Worry: Not on file    Inability: Not on file  . Transportation needs:    Medical: Not on file    Non-medical: Not on file  Tobacco Use  . Smoking status: Never Smoker  . Smokeless tobacco: Never Used  Substance and Sexual Activity  . Alcohol use: No  . Drug use: No  . Sexual activity: Not on file  Lifestyle  . Physical activity:    Days per week: Not on file    Minutes per session: Not on file  . Stress: Not on file  Relationships  . Social connections:    Talks on phone: Not on file    Gets together: Not on file    Attends religious service: Not on file    Active member of club or organization: Not on file    Attends meetings of clubs or organizations: Not on file    Relationship status: Not on file  . Intimate partner violence:    Fear of current or ex partner: Not on file    Emotionally abused: Not on file    Physically abused: Not on file    Forced sexual activity: Not on file  Other Topics Concern  . Not on file  Social History Narrative  . Not on file    FAMILY HISTORY:   Family Status  Relation Name Status  . Mother  Deceased  . Father  Deceased  . Brother  Alive  . Brother  Alive  . Son  Alive  . Brother  Alive  . Son  Alive  . Son  Alive    ROS: ROS   PHYSICAL EXAMINATION:    VITALS:   There were no vitals filed for this visit. GEN:  The patient appears stated age and is in  NAD. HEENT:  Normocephalic, atraumatic.  The mucous membranes are moist. The superficial temporal arteries are without ropiness or tenderness. CV:  RRR Lungs:  CTAB Neck/HEME:  There are no carotid bruits bilaterally.  Neurological examination:  Orientation: The patient is alert and oriented x3. Cranial nerves: There is good facial symmetry. The speech is fluent and clear. Soft palate rises symmetrically and there is no tongue deviation. Hearing is intact to conversational tone. Sensation: Sensation is intact to light touch throughout Motor: Strength is 5/5 in the bilateral upper and lower extremities.   Shoulder shrug is equal and symmetric.  There is no pronator drift.  Movement examination: Tone: There is mild increased tone in the right upper and right lower extremity.  Tone elsewhere is normal. Abnormal movements: None Coordination:  There is  decremation with RAM's, with any form of RAMS, including alternating supination and pronation of the forearm, hand opening and closing, finger taps, heel taps and toe taps, right more than left Gait and Station: The patient has no difficulty arising out of a deep-seated chair without the use of the hands. The patient's stride length is decreased with decreased arm swing bilaterally.  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    Labs:  ***I reviewed her lab work from August 01, 2018.  Hemoglobin A1c was 6.1.  ASSESSMENT/PLAN:  1.  Parkinson's disease  -increase carbidopa/levodopa 25/100 from 1.5 mg tid to carbidopa/levodopa 25/100, 2/1/2/1.  She is  to pay attention to the timing of cramping.    -***  2.  Sialorrhea  -This is commonly associated with PD.  We talked about treatments.  The patient is not a candidate for oral anticholinergic therapy because of increased risk of confusion and falls.  We discussed myobloc and 1% atropine drops.  We discusssed that candy like lemon drops (sugar free) can help by stimulating mm of the oropharynx to induce  swallowing.  She is going to think about the options.    3.  Chronic low back pain  -She has been on pain medication and continues to be on pain medication.  This was previously followed by her neurologist and was receiving epidural steroid injections.  This is not something that our office provides.  However, she did take herself off of gabapentin due to dreams and back pain is worse.  Explained that vivid dreams are part of PD and likely not related to gabapentin.  She wants to go back on it and I have no objection.  4.  chronic kidney disease  -son on pramipexole.  Told her that I don't want to use in her age group but it also can interfere with renal fxn (has to be dosed renally)  5.  ***   Cc:  Claudia Maker., MD

## 2018-10-27 ENCOUNTER — Ambulatory Visit: Payer: Medicare Other | Admitting: Neurology

## 2018-11-04 NOTE — Progress Notes (Signed)
Claudia Sanders was seen today in the movement disorders clinic for neurologic consultation at the request of [name], [name], MD.  The consultation is for the evaluation of PD.  The records that were made available to me were reviewed.  She has been taken care of neurologically by Dr. [name] [facility].  She was last seen on 08/31/17.  At that visit her pramipexole 1mg  tid was d/c and she was started on carbidopa/levodopa 25/100 tid.  She ended up seeing her PCP and told him she wanted to d/c the carbidopa/levodopa and restart pramipexole.   She is still on carbidopa/levodopa 25/100 tid.  She states that she heard that the pramipexole and carbidopa/levodopa 25/100 work best together and she really would like to consider this.     Most of patients neuro records are focused on back pain, ESI and not on PD management.  Pt is unsure of when she was dx.  She initially states that she was dx 6 years ago and then states 2 years ago.   States that her 81 y/o son was dx with PD and she noted that small handwriting was a sx.  She also noted drooling and balance issues.   Neuroimaging of the brain has previously been performed.  It is not available for my review today.  She was told that she previously had a stroke that she didn't know about  02/03/18 update: Patient is seen today in follow-up. This patient is accompanied in the office by her spouse who supplements the history.  her levodopa was increased last visit so that she is on carbidopa/levodopa 25/100, 1.5 tablets 3 times per day.   Her only c/o inner tremor.  States that it may be when wearing off.  Will take 1/2 xanax to help.  Does this bid.   Has some c/o that R leg draws and toes may cramp.  Her husband reports that she has always had RLS.  Pt denies falls.  Notes that much better since riding bicycle and people comment on how well she looks.  Riding recumbant bike at home.  Stopped taking gabapentin due to vivid dreams.  back pain is worse and  asks if she should restart.  Having fatigue and doesn't think med related.  She takes a lengthy nap during the day and then gets up with trouble sleeping at night.  Pt denies lightheadedness, near syncope.  No hallucinations.  Mood has been good.  The records that were made available to me were reviewed via care everywhere.  06/06/18 update: Patient is seen today in follow-up for parkinsonism.  Patient is on carbidopa/levodopa 25/100, which was increased to 2 tablets 3 times per day last visit.  she reports she is still taking 1.5 tabs tid (7:30am/1pm/6pm) because ran out of meds.   She was told to try an extra half tablet as needed for tremor instead of trying Xanax.  She does this "on occasion but not a lot."  States that she has cut down somewhat on xanax - takes 60 days to go through 60 pills even though written for bid dosing.  States that her son has PD but is on xanax and ativan and asks me about that.  Still with inner tremor and still treating that with xanax.  Biggest c/o is cramping and drawing of the right leg.  She doesn't know if it is a certain time of day.  she doesn't know if it happens after she takes carbidopa/levodopa, she  just know that it happens "a lot."  R arm cramps too but she attributes that to injury in the past.  Having word finding trouble.  Asks me about clinical trial for pill that [facility] reports could cure PD.    Records have been reviewed that are available to me.  There are very few office notes, but multiple telephone calls and an echocardiogram is noted since last visit.  11/08/18 update: Patient is seen today in follow-up for parkinsonism.  Patient is on carbidopa/levodopa 25/100 which was increased to 2/1/2/1.  I reiterated last visit that I wanted her to take levodopa for parkinsonian symptoms and not Xanax.  August primary care notes are reviewed.  At that point, patient reported that she was taking carbidopa/levodopa 25/100, 2 tablets in the morning, 1 in the afternoon and  1 in the evening.  Xanax was refilled at 1 mg twice per day.  States today she is taking carbidopa/levodopa 25/100, 2 at 7am/1 at 11am/2 at 3pm/1 at 7pm.  States that she is having agitation and anxiety and "my nurse practitioner looked up the side effects of levodopa and said that it was a side effect."  Admits that she has been under stress.  Hasn't been doing exercises.  Had a fall last Friday night into an umbrella stand and hit her side and arm.  Drooling has been better.  PREVIOUS MEDICATIONS: Sinemet and Mirapex  ALLERGIES:   Allergies  Allergen Reactions  . Amlodipine   . Buspar [Buspirone]   . Ciprofloxacin   . Erythromycin   . Estradiol   . Fosamax [Alendronate Sodium]   . Iodine   . Losartan   . Metoprolol   . Penicillins   . Rofecoxib   . Tobrex [Tobramycin]     CURRENT MEDICATIONS:  Outpatient Encounter Medications as of 11/08/2018  Medication Sig  . ALPRAZolam (XANAX) 1 MG tablet Take 1 mg by mouth 2 (two) times daily as needed for anxiety.   Marland Kitchen b complex vitamins tablet Take 1 tablet by mouth daily.  . carbidopa-levodopa (SINEMET IR) 25-100 MG tablet Take 2 tablets at 7 am, 1 tablet at 11 am, 2 tablets at 3 pm and 1 tablet at 7 pm.  . cholecalciferol (VITAMIN D) 1000 units tablet Take 1,000 Units by mouth daily.  . clobetasol cream (TEMOVATE) 0.05 % Apply 1 application topically 2 (two) times daily.  . Cranberry 500 MG CAPS Take by mouth.  . gabapentin (NEURONTIN) 100 MG capsule Take 300 mg by mouth at bedtime.  Marland Kitchen glucosamine-chondroitin 500-400 MG tablet Take 1 tablet by mouth 3 (three) times daily.  . hydrocortisone 2.5 % cream Apply topically 2 (two) times daily.  Marland Kitchen labetalol (NORMODYNE) 100 MG tablet Take 100 mg by mouth 2 (two) times daily.  Marland Kitchen LACTOBACILLUS PO Take by mouth.  . Multiple Vitamins-Minerals (PRESERVISION AREDS 2+MULTI VIT PO) Take by mouth.  . Sodium Fluoride (PREVIDENT 5000 BOOSTER) 1.1 % PSTE Place onto teeth.  . [DISCONTINUED] atenolol  (TENORMIN) 50 MG tablet Take 50 mg by mouth daily.   No facility-administered encounter medications on file as of 11/08/2018.     PAST MEDICAL HISTORY:   Past Medical History:  Diagnosis Date  . Anxiety   . Aortic valve insufficiency   . Benign essential hypertension   . Chronic kidney disease   . Chronic pain syndrome    chronic back pain  . CVA (cerebral vascular accident) (HCC)   . Hyperlipidemia   . Lumbar radiculopathy   . Parkinson's  disease (HCC)   . RLS (restless legs syndrome)   . Type 2 diabetes mellitus (HCC)     PAST SURGICAL HISTORY:   Past Surgical History:  Procedure Laterality Date  . ANTERIOR AND POSTERIOR REPAIR    . BLADDER SURGERY     urostomy  . CHOLECYSTECTOMY    . OVARIAN CYST REMOVAL    . TUBAL LIGATION    . UROFLOWMETRY SIMPLE / COMPLEX      SOCIAL HISTORY:   Social History   Socioeconomic History  . Marital status: Married    Spouse name: Not on file  . Number of children: Not on file  . Years of education: Not on file  . Highest education level: Not on file  Occupational History  . Occupation: retired    Comment: Engineer, civil (consulting)  Social Needs  . Financial resource strain: Not on file  . Food insecurity:    Worry: Not on file    Inability: Not on file  . Transportation needs:    Medical: Not on file    Non-medical: Not on file  Tobacco Use  . Smoking status: Never Smoker  . Smokeless tobacco: Never Used  Substance and Sexual Activity  . Alcohol use: No  . Drug use: No  . Sexual activity: Not on file  Lifestyle  . Physical activity:    Days per week: Not on file    Minutes per session: Not on file  . Stress: Not on file  Relationships  . Social connections:    Talks on phone: Not on file    Gets together: Not on file    Attends religious service: Not on file    Active member of club or organization: Not on file    Attends meetings of clubs or organizations: Not on file    Relationship status: Not on file  . Intimate partner  violence:    Fear of current or ex partner: Not on file    Emotionally abused: Not on file    Physically abused: Not on file    Forced sexual activity: Not on file  Other Topics Concern  . Not on file  Social History Narrative  . Not on file    FAMILY HISTORY:   Family Status  Relation Name Status  . Mother  Deceased  . Father  Deceased  . Brother  Alive  . Brother  Alive  . Son  Alive  . Brother  Alive  . Son  Alive  . Son  Alive    ROS: Review of Systems  Constitutional: Positive for malaise/fatigue.  HENT: Negative.   Eyes: Negative.   Respiratory: Negative.   Cardiovascular: Negative.   Gastrointestinal: Negative.   Genitourinary: Negative.   Musculoskeletal: Negative.   Skin: Negative.   Endo/Heme/Allergies: Negative.      PHYSICAL EXAMINATION:    VITALS:   Vitals:   11/08/18 0827  BP: 98/60  Pulse: 60  SpO2: 98%  Weight: 127 lb (57.6 kg)  Height: 4\' 10"  (1.473 m)   GEN:  The patient appears stated age and is in NAD. HEENT:  Normocephalic, atraumatic.  The mucous membranes are moist. The superficial temporal arteries are without ropiness or tenderness. CV:  RRR Lungs:  CTAB Neck/HEME:  There are no carotid bruits bilaterally.  Neurological examination:  Orientation: The patient is alert and oriented x3. Cranial nerves: There is good facial symmetry. The speech is fluent and clear. Soft palate rises symmetrically and there is no tongue deviation. Hearing is intact  to conversational tone. Sensation: Sensation is intact to light touch throughout Motor: Strength is 5/5 in the bilateral upper and lower extremities.   Shoulder shrug is equal and symmetric.  There is no pronator drift.  Movement examination: Tone: There is mild increased tone in the right upper extremity.  Tone elsewhere is normal Abnormal movements: None Coordination:  There is  decremation with RAM's, with any form of RAMS, including alternating supination and pronation of the forearm,  hand opening and closing, finger taps, heel taps and toe taps, right more than left Gait and Station: The patient has no difficulty arising out of a deep-seated chair without the use of the hands. The patient's stride length is decreased with decreased arm swing on the right.  Drags heels with ambulation  Labs:  I reviewed her lab work from August 01, 2018.  Hemoglobin A1c was 6.1.  ASSESSMENT/PLAN:  1.  Parkinson's disease  -continue carbidopa/levodopa 25/100, 2/1/2/1.    2.  Sialorrhea  -This is commonly associated with PD.  We talked about treatments.  The patient is not a candidate for oral anticholinergic therapy because of increased risk of confusion and falls.  We discussed myobloc and 1% atropine drops.  We discusssed that candy like lemon drops (sugar free) can help by stimulating mm of the oropharynx to induce swallowing.  She feels that she is doing better  3.  Chronic low back pain  -She has been on pain medication and continues to be on pain medication.  This was previously followed by her neurologist and was receiving epidural steroid injections.  This is not something that our office provides.  Back on gabapentin  4.  F/u 5 months    Cc:  Dan Maker., MD

## 2018-11-08 ENCOUNTER — Encounter: Payer: Self-pay | Admitting: Neurology

## 2018-11-08 ENCOUNTER — Ambulatory Visit (INDEPENDENT_AMBULATORY_CARE_PROVIDER_SITE_OTHER): Payer: Medicare Other | Admitting: Neurology

## 2018-11-08 VITALS — BP 98/60 | HR 60 | Ht <= 58 in | Wt 127.0 lb

## 2018-11-08 DIAGNOSIS — G20 Parkinson's disease: Secondary | ICD-10-CM

## 2018-11-08 DIAGNOSIS — G20A1 Parkinson's disease without dyskinesia, without mention of fluctuations: Secondary | ICD-10-CM

## 2018-11-08 NOTE — Patient Instructions (Signed)
Support & Resources  You are not alone. Being diagnosed with Parkinson's disease can be an emotional diagnosis, and one that impacts your whole family. Our goal is to make sure you're getting support not just while you're at [facility], but in the days between appointments. Our social worker will work with both patients and their support systems to help you cope with the changes your diagnosis brings to your everyday life. You will learn how to recognize and adjust to new needs, receive information about coping skills related to disease progression, receive counseling and be plugged into community resources and other areas of support. You can contact our social worker, Jessica S. Thomas, for resources and support at [phone].   [facility] Parkinson's Community Support Group This group meets every third Tuesday from 4:00 p.m. - 5:00 p.m. at the [facility]. This group provides information about how Parkinson's disease affects you as an individual, how to proactively take control of Parkinson's disease, and steps to manage your Parkinson's disease, education, and information about exercise for lifelong activity.  Other Local Support Groups: (please call group leader to confirm meeting location) City Monthly Meeting Day/Time Location Group Leader  [place]  1st Tuesday  (no June/July mtg) 10:30 am                                                                  [address], [place], [place] [address] Annette Caughron                            [phone]                                    [email or web address]         [place]  2nd Tuesday   10:30 am  [address]      [place], [place] [address] Bill Amidon                                         [phone]                         [email or web address]   [place]        [place]) 1st Thursday     10:30 am [address] [place], [place] [address] Katie Page                                          [phone]   [place]- early onset  Varies  PD [employer]  Tim Hudson                                      [email or web address]  [place]  3rd Monday     2pm [address], [place], [place] [address] Jessica Thomas, LCSW                        [email or web address]  [place] 2nd Thursday          10am [address] [place], [place] [address] Kathy Coolidge                      [phone]                       [email or web address]       [place]  3rd Wednesday      7pm  [address] [place], [place] [address] [phone]          

## 2019-03-07 ENCOUNTER — Other Ambulatory Visit: Payer: Self-pay | Admitting: Neurology

## 2019-03-30 NOTE — Progress Notes (Signed)
Virtual Visit via Video Note The purpose of this virtual visit is to provide medical care while limiting exposure to the novel coronavirus.    Consent was obtained for video visit:  Yes.   Answered questions that patient had about telehealth interaction:  Yes.   I discussed the limitations, risks, security and privacy concerns of performing an evaluation and management service by telemedicine. I also discussed with the patient that there may be a patient responsible charge related to this service. The patient expressed understanding and agreed to proceed.  Pt location: Home Physician Location: office Name of referring provider:  Dan Maker., MD I connected with Claudia Sanders at patients initiation/request on 03/31/2019 at 11:00 AM EDT by video enabled telemedicine application and verified that I am speaking with the correct person using two identifiers. Pt MRN:  [id number] Pt DOB:  [birth year] Video Participants:  Claudia Sanders;  husband   History of Present Illness:  Patient seen for PD.  She is supposed to be on carbidopa/levodopa 25/100, 2/1/2/1 but reports she is taking 1.5/1/1.5/1.  States that she feels "bad" when she takes a full 2 tablets but does well with the 1.5 tablets.  She describes it as anxiety.  Pt denies falls.  Pt denies lightheadedness, near syncope.  No hallucinations.  Mood has been good.  Her primary care records were reviewed from March 06, 2019.  She was seen for follow-up of weight loss, which had been stable at her last visit.  She was found to have a urinary tract infection, with urine culture positive for greater than 100,000 colonies of E. coli.  Pt states that she has had 3 different meds and still isn't sure that it is resolved.  She has a follow up with PCP.  Continues on Xanax, 1 mg and states she takes 1/2 tablet 2-3 times per day.  PMP aware is reviewed.   Observations/Objective:   Vitals:   03/31/19 1024  BP: 137/82  Pulse: 65  Temp: (!) 96.7 F  (35.9 C)  Weight: 115 lb 4.8 oz (52.3 kg)  Height: 4\' 10"  (1.473 m)   GEN:  The patient appears stated age and is in NAD.  Neurological examination:  Orientation: The patient is alert and oriented x3. Cranial nerves: There is good facial symmetry. There is no significant facial hypomimia.  The speech is fluent and clear. Soft palate rises symmetrically and there is no tongue deviation. Hearing is intact to conversational tone. Motor: Strength is at least antigravity x 4.   Shoulder shrug is equal and symmetric.  There is no pronator drift.  Movement examination: Tone: unable Abnormal movements: None noted Coordination:  There is decremation with all rapid alternating movements bilaterally Gait and Station: The patient has no difficulty arising out of a deep-seated chair without the use of the hands. The patient's stride length is good, with decreased arm swing on the bilaterally.      Assessment and Plan:   1.  Parkinson's disease             -She is supposed to be on carbidopa/levodopa 25/100, 2/1/2/1, but states that she feels better on 1.5/1/1.5/1 so I did not change that today.  Overall, I think that she generally treats her Parkinson's disease inner tremor with Xanax, which is not preferable (as she translates that as anxiety and I think that not all of it is).  However, she doesn't want to increase the medication for Parkinson's and generally states that she  feels well.  She will let me know when she needs a refill.  -Discussed in detail Covid-19 and risk factors for this, including age and PD.  Discussed importance of social distancing.  Discussed importance of staying home at all times, as is feasible.  Discussed taking advantage of grocery store hours for the elderly.  Pt expressed understanding.  2.  Sialorrhea             -This is commonly associated with PD.  We talked about treatments.  The patient is not a candidate for oral anticholinergic therapy because of increased risk  of confusion and falls.  We discussed myobloc and 1% atropine drops.  We discusssed that candy like lemon drops (sugar free) can help by stimulating mm of the oropharynx to induce swallowing.  She feels that she is doing better  3.  Chronic low back pain             -She has been on pain medication and continues to be on pain medication.  This was previously followed by her neurologist and was receiving epidural steroid injections.  This is not something that our office provides.  Back on gabapentin  4.  GAD  -would like to see the patient off of chronic benzo's and have discussed this in the past.  Is on xanax 1 mg bid (generally takes half tablet 2-3 times per day).  Increases risk for falls and cognitive change in this age group, especially with PD.  Pt aware.   Follow Up Instructions:    -I discussed the assessment and treatment plan with the patient. The patient was provided an opportunity to ask questions and all were answered. The patient agreed with the plan and demonstrated an understanding of the instructions.   The patient was advised to call back or seek an in-person evaluation if the symptoms worsen or if the condition fails to improve as anticipated.    Total Time spent in visit with the patient was: 15 minutes, of which more than 50% of the time was spent in counseling and/or coordinating care on safety.   Pt understands and agrees with the plan of care outlined.     Kerin Salen, DO

## 2019-03-31 ENCOUNTER — Telehealth (INDEPENDENT_AMBULATORY_CARE_PROVIDER_SITE_OTHER): Payer: Medicare Other | Admitting: Neurology

## 2019-03-31 ENCOUNTER — Other Ambulatory Visit: Payer: Self-pay

## 2019-03-31 ENCOUNTER — Encounter: Payer: Self-pay | Admitting: Neurology

## 2019-03-31 DIAGNOSIS — G20 Parkinson's disease: Secondary | ICD-10-CM | POA: Diagnosis not present

## 2019-04-10 ENCOUNTER — Ambulatory Visit: Payer: Medicare Other | Admitting: Neurology

## 2019-07-21 ENCOUNTER — Other Ambulatory Visit: Payer: Self-pay | Admitting: Neurology

## 2019-07-24 NOTE — Telephone Encounter (Signed)
Requested Prescriptions   Pending Prescriptions Disp Refills  . carbidopa-levodopa (SINEMET IR) 25-100 MG tablet [Pharmacy Med Name: CARBIDOPA/LEVODOPA 25-100 MG Tablet] 540 tablet 1    Sig: TAKE 2 TABLETS AT 7 AM, 1 TABLET AT 11 AM, 2 TABLETS AT 3 PM AND 1 TABLET AT 7 PM.   Rx last filled:03/08/19 #540 1 refills  Pt last seen: 03/31/19    Follow up appt scheduled:09/20/19

## 2019-08-29 HISTORY — PX: KIDNEY STONE SURGERY: SHX686

## 2019-09-18 NOTE — Progress Notes (Signed)
Virtual Visit via Video Note The purpose of this virtual visit is to provide medical care while limiting exposure to the novel coronavirus.    Consent was obtained for video visit:  Yes.   Answered questions that patient had about telehealth interaction:  Yes.   I discussed the limitations, risks, security and privacy concerns of performing an evaluation and management service by telemedicine. I also discussed with the patient that there may be a patient responsible charge related to this service. The patient expressed understanding and agreed to proceed.  Pt location: Home Physician Location: home Name of referring provider:  Dan Maker., MD I connected with Claudia Sanders at patients initiation/request on 09/20/2019 at  9:45 AM EDT by video enabled telemedicine application and verified that I am speaking with the correct person using two identifiers. Pt MRN:  [id number] Pt DOB:  1935-06-26 Video Participants:  Claudia Sanders;     History of Present Illness:  Patient seen for PD.  She is on carbidopa/levodopa 25/100, 2 tablets / 1 tablet/2 tablets / 1 tablet (she went up on it a few weeks ago and didn't have any trouble like she did in the past).  I had her on a higher dose in the past, but she thought it caused anxiety.  She does take it Xanax, 1 mg, half tablet 2-3 times per day.  PMP aware is reviewed.  She goes through 60 tablets about every 30 to 45 days.  She denies falls.  Denies lightheadedness or near syncope.  Denies hallucinations.  Medical records are reviewed since last visit.  Saw ophthalmology on September 13, 2019.  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  States that she had a kidney stone removed a few weeks ago.   Current Outpatient Medications:  .  ALPRAZolam (XANAX) 1 MG tablet, Take 1 mg by mouth 2 (two) times daily as needed for anxiety. Taking 0.5 tab 2-3 x QD PRN, Disp: , Rfl:  .  ALPRAZolam (XANAX) 1 MG tablet, Take 1 tablet by mouth twice daily  as needed for anxiety., Disp: , Rfl:  .  atenolol (TENORMIN) 50 MG tablet, Take 50 mg by mouth daily., Disp: , Rfl:  .  b complex vitamins tablet, Take 1 tablet by mouth daily., Disp: , Rfl:  .  carbidopa-levodopa (SINEMET IR) 25-100 MG tablet, Take 1.5 tablets at 7 am, 1 tablet at 11 am, 2 tablets at 1.5 pm and 1 tablet at 7 pm., Disp: 540 tablet, Rfl: 1 .  Carbidopa-Levodopa ER (SINEMET CR) 25-100 MG tablet controlled release, Take 2 tablets by mouth. Take 2 at 7AM/ 1 at 11AM/ 2 at 3PM/ 1 at 7PM, Disp: , Rfl:  .  cholecalciferol (VITAMIN D) 1000 units tablet, Take 1,000 Units by mouth daily., Disp: , Rfl:  .  clobetasol cream (TEMOVATE) 0.05 %, Apply 1 application topically 2 (two) times daily., Disp: , Rfl:  .  Cranberry 500 MG CAPS, Take by mouth., Disp: , Rfl:  .  gabapentin (NEURONTIN) 100 MG capsule, Take 300 mg by mouth at bedtime., Disp: , Rfl:  .  gabapentin (NEURONTIN) 300 MG capsule, Take one at bedtime, Disp: , Rfl:  .  glucosamine-chondroitin 500-400 MG tablet, Take 1 tablet by mouth 2 (two) times daily. , Disp: , Rfl:  .  hydrALAZINE (APRESOLINE) 10 MG tablet, Take by mouth., Disp: , Rfl:  .  hydrocortisone 2.5 % cream, Apply topically 2 (two) times daily., Disp: , Rfl:  .  LACTOBACILLUS  PO, Take by mouth., Disp: , Rfl:  .  Multiple Vitamins-Minerals (PRESERVISION AREDS 2+MULTI VIT PO), Take by mouth., Disp: , Rfl:  .  rosuvastatin (CRESTOR) 5 MG tablet, , Disp: , Rfl:  .  Sodium Fluoride (PREVIDENT 5000 BOOSTER) 1.1 % PSTE, Place onto teeth., Disp: , Rfl:  .  vitamin E (VITAMIN E) 400 UNIT capsule, Take 400 Units by mouth daily., Disp: , Rfl:     Observations/Objective:   Vitals:   09/20/19 0759  Weight: 112 lb 9.6 oz (51.1 kg)  Height: 4\' 11"  (1.499 m)   GEN:  The patient appears stated age and is in NAD.  Neurological examination:  Orientation: The patient is alert and oriented x3. Cranial nerves: There is good facial symmetry. There is no significant facial  hypomimia.  The speech is fluent and clear. Soft palate rises symmetrically and there is no tongue deviation. Hearing is intact to conversational tone. Motor: Strength is at least antigravity x 4.   Shoulder shrug is equal and symmetric.  There is no pronator drift.  Movement examination: Tone: unable Abnormal movements: None noted Coordination:  There is decremation with finger taps and action of "turning in a lightbulb" bilaterally.  It is mild.   Gait and Station: The patient has no difficulty arising out of a deep-seated chair without the use of the hands. The patient's stride length is good, with decreased arm swing on the bilaterally, R more than L  Lab work is reviewed.  Lab work was last done in August 30, 2019.  Sodium was 138, potassium 4.6, chloride 101, CO2 28, BUN 28, creatinine 1.40 (this was a significant increase from 0.93 on July 18, 2019).  Glucose was 126.  Last CBC was on August 29, 2019.  White blood cells were 8.7, hemoglobin 13.4 (the following day hemoglobin was 11.8) hematocrit 41.0 and platelets 217.  Assessment and Plan:   1.  Parkinson's disease             -continue carbidopa/levodopa 25/100, 2/1/2/1.  Overall, I think that she generally treats her Parkinson's disease inner tremor with Xanax, which is not preferable (as she translates that as anxiety and I think that not all of it is).    2.  Sialorrhea  -This is commonly associated with PD.  We talked about treatments.  The patient is not a candidate for oral anticholinergic therapy because of increased risk of confusion and falls.  We discussed Botox (type A and B) and 1% atropine drops.  We discusssed that candy like lemon drops can help by stimulating mm of the oropharynx to induce swallowing.  She feels that she is doing well now.  3.  Chronic low back pain             -off of narcotics.    4.  GAD  -would like to see the patient off of chronic benzo's and have discussed this in the past.  Is on xanax 1  mg bid (generally takes half tablet 2-3 times per day).  Increases risk for falls and cognitive change in this age group, especially with PD.  Pt aware.   Follow Up Instructions:  f/u 6 months  -I discussed the assessment and treatment plan with the patient. The patient was provided an opportunity to ask questions and all were answered. The patient agreed with the plan and demonstrated an understanding of the instructions.   The patient was advised to call back or seek an in-person evaluation if the symptoms  worsen or if the condition fails to improve as anticipated.    Total Time spent in visit with the patient was: 16  minutes, of which more than 50% of the time was spent in counseling and/or coordinating care on safety.   Pt understands and agrees with the plan of care outlined.     Kerin Salen, DO

## 2019-09-19 ENCOUNTER — Other Ambulatory Visit: Payer: Self-pay

## 2019-09-20 ENCOUNTER — Encounter: Payer: Self-pay | Admitting: Neurology

## 2019-09-20 ENCOUNTER — Other Ambulatory Visit: Payer: Self-pay

## 2019-09-20 ENCOUNTER — Telehealth (INDEPENDENT_AMBULATORY_CARE_PROVIDER_SITE_OTHER): Payer: Medicare Other | Admitting: Neurology

## 2019-09-20 VITALS — Ht 59.0 in | Wt 112.6 lb

## 2019-09-20 DIAGNOSIS — G20 Parkinson's disease: Secondary | ICD-10-CM | POA: Diagnosis not present

## 2020-03-19 ENCOUNTER — Ambulatory Visit: Payer: Medicare Other | Admitting: Neurology

## 2020-03-19 NOTE — Progress Notes (Signed)
Assessment/Plan:   1.  Parkinsons Disease  -Continue carbidopa/levodopa 25/100, 2/1/2/1  -Another discussion about the fact that I really do not think she should be treating her in her tremor with Xanax.    -[facility]  -pt asked for RX for transport chair.  Told her that I wasn't sure that insurance would pay for it but if not, that they are aroun $100 on [employer]  2.  Sialorrhea  -This is commonly associated with PD.  We talked about treatments.  The patient is not a candidate for oral anticholinergic therapy because of increased risk of confusion and falls.  We discussed Botox (type A and B) and 1% atropine drops.  We discusssed that candy like lemon drops can help by stimulating mm of the oropharynx to induce swallowing.  She doesn't want to do the botox but is considering it.    3.  GAD  -On chronic benzodiazepines.  Receiving both alprazolam and recently got Ativan as well (from Dr. Donell Beers 1 time).  Regardless, patient understands that I really do not like this chronic benzodiazepine in this age group and with Parkinson's disease.  Both of these things will potentiate falls and confusion.  Encouraged her to get back to Dr. Donell Beers.  She states that Dr. Donell Beers was a friend (she used to work for him). She states that she will get back to him and has an appt with him.    -asks about increasing gabapentin for anxiety.  Told her to discuss with PCP or with prescribing physician.  4.  Mild PDD  -not bothersome to the patient.  Decided not to tx  5.  Weight loss  -talked about adding supplements.  She was taking boost but she felt that the iodine was causing anxiety/agitation so she d/c it.  Needs to f/u with pcp.    Subjective:   Claudia Sanders was seen today in follow up for Parkinsons disease.  My previous records were reviewed prior to todays visit as well as outside records available to me. Pt denies falls.  Pt denies lightheadedness, near syncope.  Brings  BP log - takes BP before her meds in the AM and BP in the 130's to low 150's.   No hallucinations.  Still with drooling - carrying kleenex but doesn't want to do botox.  States that her anxiety has just been "terrible."  States that her son had covid and then a stroke and that was part of her anxiety.  Her husband had to have a PPM.  She thinks that her gabapentin helps some but wants to increase that and asks about that.  Current prescribed movement disorder medications: Carbidopa/levodopa 25/100, 2/1/2/1   PREVIOUS MEDICATIONS: Sinemet  ALLERGIES:   Allergies  Allergen Reactions  . Amlodipine   . Buspar [Buspirone]   . Ciprofloxacin   . Erythromycin   . Estradiol   . Fosamax [Alendronate Sodium]   . Iodine   . Losartan   . Metoprolol   . Other     Erythromycin eye drops  . Penicillins   . Rofecoxib   . Tobrex [Tobramycin]     CURRENT MEDICATIONS:  Outpatient Encounter Medications as of 03/21/2020  Medication Sig  . ALPRAZolam (XANAX) 0.25 MG tablet Take 1 mg by mouth 4 (four) times daily as needed for anxiety (for sleep).   Marland Kitchen atenolol (TENORMIN) 100 MG tablet Take 100 mg by mouth daily.   Marland Kitchen b complex vitamins tablet Take 1 tablet  by mouth daily.  . Calcium Carbonate-Vit D-Min (CALCIUM 1200 PO) Take 1 tablet by mouth daily.  . carbidopa-levodopa (SINEMET IR) 25-100 MG tablet Take 1 tablet by mouth. Take 2 tablets at 7 am, 1 tablet at 11 am, 2 tablets at 3 pm and 1 tablet at 7 pm.  . cholecalciferol (VITAMIN D) 1000 units tablet Take 1,000 Units by mouth daily.  . clobetasol cream (TEMOVATE) 9.02 % Apply 1 application topically 2 (two) times daily.  . Cranberry 500 MG CAPS Take by mouth daily as needed.   . gabapentin (NEURONTIN) 300 MG capsule Take one at bedtime  . glucosamine-chondroitin 500-400 MG tablet Take 1 tablet by mouth 2 (two) times daily.   . hydrocortisone 2.5 % cream Apply topically 2 (two) times daily.  Marland Kitchen lisinopril (ZESTRIL) 5 MG tablet Take 5 mg by mouth  daily.  . Multiple Vitamins-Minerals (PRESERVISION AREDS 2+MULTI VIT PO) Take by mouth.  . rosuvastatin (CRESTOR) 5 MG tablet   . [DISCONTINUED] ALPRAZolam (XANAX) 1 MG tablet Take 1 tablet by mouth twice daily as needed for anxiety.  . [DISCONTINUED] carbidopa-levodopa (SINEMET IR) 25-100 MG tablet Take 1.5 tablets at 7 am, 1 tablet at 11 am, 2 tablets at 1.5 pm and 1 tablet at 7 pm. (Patient not taking: Reported on 03/21/2020)  . [DISCONTINUED] Carbidopa-Levodopa ER (SINEMET CR) 25-100 MG tablet controlled release Take 2 tablets by mouth. Take 2 at 7AM/ 1 at 11AM/ 2 at 3PM/ 1 at [facility]  . [DISCONTINUED] gabapentin (NEURONTIN) 100 MG capsule Take 300 mg by mouth at bedtime.  . [DISCONTINUED] hydrALAZINE (APRESOLINE) 10 MG tablet Take by mouth.  . [DISCONTINUED] LACTOBACILLUS PO Take by mouth.  . [DISCONTINUED] Sodium Fluoride (PREVIDENT 5000 BOOSTER) 1.1 % PSTE Place onto teeth.  . [DISCONTINUED] vitamin E (VITAMIN E) 400 UNIT capsule Take 400 Units by mouth daily.   No facility-administered encounter medications on file as of 03/21/2020.    Objective:   PHYSICAL EXAMINATION:    VITALS:   Vitals:   03/21/20 1255  BP: (!) 152/82  Pulse: 64  SpO2: 97%  Weight: 107 lb (48.5 kg)  Height: 4\' 10"  (1.473 m)   Wt Readings from Last 3 Encounters:  03/21/20 107 lb (48.5 kg)  09/20/19 112 lb 9.6 oz (51.1 kg)  03/31/19 115 lb 4.8 oz (52.3 kg)     GEN:  The patient appears stated age and is in NAD. HEENT:  Normocephalic, atraumatic.  The mucous membranes are moist. The superficial temporal arteries are without ropiness or tenderness. CV:  RRR Lungs:  CTAB Neck/HEME:  There are no carotid bruits bilaterally.  Neurological examination:  Orientation: The patient is alert and oriented x3. Cranial nerves: There is good facial symmetry with facial hypomimia. The speech is fluent and clear. Soft palate rises symmetrically and there is no tongue deviation. Hearing is intact to conversational  tone. Sensation: Sensation is intact to light touch throughout Motor: Strength is at least antigravity x4.  Movement examination: Tone: There is mild increased tone in the RUE Abnormal movements: mild dyskinesia in the L leg Coordination:  There is  decremation with RAM's, with any form of RAMS, including alternating supination and pronation of the forearm, hand opening and closing, finger taps, heel taps and toe taps, L more than R Gait and Station: The patient has mild difficulty arising out of a deep-seated chair without the use of the hands. The patient's stride length is decreased with decreased arm swing bilaterally.  She shuffles  Lab work  is independently reviewed and interpreted by me.  Lab work was dated March 13, 2020. Hemoglobin A1c was 6.4.  Sodium was 141, potassium 4.4, chloride 105, CO2 28, BUN 32, creatinine 1.06, AST 18, ALT 4.  Last TSH was done September 19, 2019.  White blood cells were 9.0, hemoglobin 12.6, hematocrit 39.5 and platelets 190.  Total time spent on today's visit was 30 minutes, including both face-to-face time and nonface-to-face time.  Time included that spent on review of records (prior notes available to me/labs/imaging if pertinent), discussing treatment and goals, answering patient's questions and coordinating care.  Cc:  Dan Maker., MD

## 2020-03-21 ENCOUNTER — Encounter: Payer: Self-pay | Admitting: Neurology

## 2020-03-21 ENCOUNTER — Other Ambulatory Visit: Payer: Self-pay

## 2020-03-21 ENCOUNTER — Ambulatory Visit: Payer: Medicare HMO | Admitting: Neurology

## 2020-03-21 VITALS — BP 152/82 | HR 64 | Ht <= 58 in | Wt 107.0 lb

## 2020-03-21 DIAGNOSIS — G20 Parkinson's disease: Secondary | ICD-10-CM | POA: Diagnosis not present

## 2020-03-21 DIAGNOSIS — G249 Dystonia, unspecified: Secondary | ICD-10-CM

## 2020-03-21 DIAGNOSIS — F411 Generalized anxiety disorder: Secondary | ICD-10-CM | POA: Diagnosis not present

## 2020-03-21 MED ORDER — AMBULATORY NON FORMULARY MEDICATION: 0 refills | Status: AC

## 2020-03-21 NOTE — Patient Instructions (Signed)
1.  We will put in a referral to physical therapy at [facility].   2.  Make an appointment with Dr. Donell Beers

## 2020-03-22 ENCOUNTER — Other Ambulatory Visit: Payer: Self-pay | Admitting: Neurology

## 2020-03-22 MED ORDER — CARBIDOPA-LEVODOPA 25-100 MG PO TABS: ORAL_TABLET | ORAL | 1 refills | Status: DC

## 2020-03-22 NOTE — Telephone Encounter (Signed)
Patient is calling in about wanting info regarding the PT that was supposed to be ordered. And then she is needing the carbidopa levodopa medication to the [facility] in [place]. Thanks!

## 2020-03-22 NOTE — Telephone Encounter (Signed)
Left message for patient informing her that the referral has been placed an they will be in contact with her.  Rx(s) sent to pharmacy electronically.

## 2020-03-22 NOTE — Addendum Note (Signed)
Addended by: Kandice Robinsons T on: 03/22/2020 03:19 PM   Modules accepted: Orders

## 2020-04-01 ENCOUNTER — Telehealth: Payer: Self-pay | Admitting: Neurology

## 2020-04-01 NOTE — Telephone Encounter (Signed)
Since I haven't seen her since this, probably best that she gets from PCP.  A RX alone doesn't work.  It needs an entire evaluation so her PCP or whoever followed up with her post hospitalization should be able to help with that.

## 2020-04-01 NOTE — Telephone Encounter (Signed)
Pt called and they will reach out to the PCP

## 2020-04-01 NOTE — Telephone Encounter (Signed)
Patient needs to get a RX for a Regular Wheelchair she fell and broke some bones  Please call

## 2020-08-22 ENCOUNTER — Ambulatory Visit: Payer: Medicare HMO | Admitting: Neurology

## 2020-10-07 NOTE — Progress Notes (Deleted)
Assessment/Plan:   1.  Parkinsons Disease  -***Continue carbidopa/levodopa 25/100, 2/1/2/1  -Reiterated to the patient that I really do not want Claudia Sanders on Xanax, and I definitely do not think she should be treating tremor with Xanax.  -Interesting that son recently diagnosed with MSA.  2.  Sialorrhea  -Discussed Botox.  Declines for now.  3.  GAD  -On chronic benzodiazepines.  I would like to see Claudia Sanders off of these.  She is on both lorazepam and alprazolam.  She filled lorazepam, 30 pills on September 14, and on September 27 filled 90 tablets of alprazolam.  4.  Mild PDD  -not on meds but adds to concern for chronic benzo  Subjective:   Claudia Sanders was seen today in follow up for Parkinsons disease.  My previous records were reviewed prior to todays visit as well as outside records available to me. Pt denies falls.  Pt denies lightheadedness, near syncope.  No hallucinations.  Mood has been good.  Primary care records from September 30 are reviewed.  Son was apparently recently diagnosed with MSA.  Current prescribed movement disorder medications: ***   PREVIOUS MEDICATIONS: {Parkinson's RX:18200}  ALLERGIES:   Allergies  Allergen Reactions  . Amlodipine   . Buspar [Buspirone]   . Ciprofloxacin   . Erythromycin   . Estradiol   . Fosamax [Alendronate Sodium]   . Iodine   . Losartan   . Metoprolol   . Other     Erythromycin eye drops  . Penicillins   . Rofecoxib   . Tobrex [Tobramycin]     CURRENT MEDICATIONS:  Outpatient Encounter Medications as of 10/14/2020  Medication Sig  . ALPRAZolam (XANAX) 0.25 MG tablet Take 1 mg by mouth 4 (four) times daily as needed for anxiety (for sleep).   . AMBULATORY NON FORMULARY MEDICATION Transport chair.   Dx:  G20  . atenolol (TENORMIN) 100 MG tablet Take 100 mg by mouth daily.   Marland Kitchen b complex vitamins tablet Take 1 tablet by mouth daily.  . Calcium Carbonate-Vit D-Min (CALCIUM 1200 PO) Take 1 tablet by mouth daily.  .  carbidopa-levodopa (SINEMET IR) 25-100 MG tablet Take 2 tablets at 7 am, 1 tablet at 11 am, 2 tablets at 3 pm and 1 tablet at 7 pm.  . cholecalciferol (VITAMIN D) 1000 units tablet Take 1,000 Units by mouth daily.  . clobetasol cream (TEMOVATE) 0.05 % Apply 1 application topically 2 (two) times daily.  . Cranberry 500 MG CAPS Take by mouth daily as needed.   . gabapentin (NEURONTIN) 300 MG capsule Take one at bedtime  . glucosamine-chondroitin 500-400 MG tablet Take 1 tablet by mouth 2 (two) times daily.   . hydrocortisone 2.5 % cream Apply topically 2 (two) times daily.  Marland Kitchen lisinopril (ZESTRIL) 5 MG tablet Take 5 mg by mouth daily.  . Multiple Vitamins-Minerals (PRESERVISION AREDS 2+MULTI VIT PO) Take by mouth.  . rosuvastatin (CRESTOR) 5 MG tablet    No facility-administered encounter medications on file as of 10/14/2020.    Objective:   PHYSICAL EXAMINATION:    VITALS:  There were no vitals filed for this visit.  GEN:  The patient appears stated age and is in NAD. HEENT:  Normocephalic, atraumatic.  The mucous membranes are moist. The superficial temporal arteries are without ropiness or tenderness. CV:  RRR Lungs:  CTAB Neck/HEME:  There are no carotid bruits bilaterally.  Neurological examination:  Orientation: The patient is alert and oriented x3. Cranial nerves: There is  good facial symmetry with*** facial hypomimia. The speech is fluent and clear. Soft palate rises symmetrically and there is no tongue deviation. Hearing is intact to conversational tone. Sensation: Sensation is intact to light touch throughout Motor: Strength is at least antigravity x4.  Movement examination: Tone: There is ***tone in the *** Abnormal movements: *** Coordination:  There is *** decremation with RAM's, *** Gait and Station: The patient has *** difficulty arising out of a deep-seated chair without the use of the hands. The patient's stride length is ***.  The patient has a *** pull test.        Total time spent on today's visit was ***30 minutes, including both face-to-face time and nonface-to-face time.  Time included that spent on review of records (prior notes available to me/labs/imaging if pertinent), discussing treatment and goals, answering patient's questions and coordinating care.  Cc:  Dan Maker., MD

## 2020-10-14 ENCOUNTER — Ambulatory Visit: Payer: Medicare HMO | Admitting: Neurology

## 2020-10-16 NOTE — Progress Notes (Signed)
Virtual Visit Via Video   The purpose of this virtual visit is to provide medical care while limiting exposure to the novel coronavirus.    Consent was obtained for video visit:  Yes.   Answered questions that patient had about telehealth interaction:  Yes.   I discussed the limitations, risks, security and privacy concerns of performing an evaluation and management service by telemedicine. I also discussed with the patient that there may be a patient responsible charge related to this service. The patient expressed understanding and agreed to proceed.  Pt location: Home Physician Location: office Name of referring provider:  Dan Maker., MD I connected with Chad Cordial at patients initiation/request on 10/17/2020 at  8:15 AM EDT by video enabled telemedicine application and verified that I am speaking with the correct person using two identifiers. Pt MRN:  [id number] Pt DOB:  06-Mar-1935 Video Participants:  Chad Cordial;    Assessment/Plan:   1.  Parkinsons Disease  -Continue carbidopa/levodopa 25/100, 2/1/2/1  -Reiterated to the patient that I really do not want her on Xanax, and I definitely do not think she should be treating tremor with Xanax.  -Interesting that son recently diagnosed with MSA.  2.  Sialorrhea  -Discussed Botox.  Declines for now and states that drooling has been better lately.  3.  GAD  -On chronic benzodiazepines.  I would like to see her off of these.  She is on both lorazepam and alprazolam.  She filled lorazepam, 30 pills on September 14, and on September 27 filled 90 tablets of alprazolam.  4.  Mild PDD  -not on meds but adds to concern for chronic benzo  Subjective:   Claudia Sanders was seen today in follow up for Parkinsons disease.  My previous records were reviewed prior to todays visit as well as outside records available to me. Pt denies falls.  Pt denies lightheadedness, near syncope.  No hallucinations.  Mood has been good.  Primary  care records from September 30 are reviewed.  Son was apparently recently diagnosed with MSA.  Uses recumbant bike for exercise.  Drooling has been better.  Current prescribed movement disorder medications: carbidopa/levodopa 25/100, 2/1/2/1    ALLERGIES:   Allergies  Allergen Reactions  . Amlodipine   . Buspar [Buspirone]   . Ciprofloxacin   . Erythromycin   . Estradiol   . Fosamax [Alendronate Sodium]   . Iodine   . Losartan   . Metoprolol   . Other     Erythromycin eye drops  . Penicillins   . Rofecoxib   . Tobrex [Tobramycin]     CURRENT MEDICATIONS:  Outpatient Encounter Medications as of 10/17/2020  Medication Sig  . ALPRAZolam (XANAX) 0.25 MG tablet Take 1 mg by mouth 3 (three) times daily as needed for anxiety (for sleep).   . AMBULATORY NON FORMULARY MEDICATION Transport chair.   Dx:  G20  . atenolol (TENORMIN) 100 MG tablet Take 100 mg by mouth daily.   Marland Kitchen b complex vitamins tablet Take 1 tablet by mouth daily.  . Calcium Carbonate-Vit D-Min (CALCIUM 1200 PO) Take 1 tablet by mouth daily.  . carbidopa-levodopa (SINEMET IR) 25-100 MG tablet Take 2 tablets at 7 am, 1 tablet at 11 am, 2 tablets at 3 pm and 1 tablet at 7 pm.  . Cranberry 500 MG CAPS Take by mouth daily as needed.   . gabapentin (NEURONTIN) 300 MG capsule Take 300 mg by mouth 2 (two) times daily.   Marland Kitchen  glucosamine-chondroitin 500-400 MG tablet Take 1 tablet by mouth 2 (two) times daily.   . hydrocortisone 2.5 % cream Apply topically 2 (two) times daily.  Marland Kitchen LORazepam (ATIVAN) 1 MG tablet Take 1 mg by mouth. Take tab daily and can take 1 extra tab as needed  . Multiple Vitamins-Minerals (PRESERVISION AREDS 2+MULTI VIT PO) Take 1 tablet by mouth daily.   . [DISCONTINUED] cholecalciferol (VITAMIN D) 1000 units tablet Take 1,000 Units by mouth daily. (Patient not taking: Reported on 10/17/2020)  . [DISCONTINUED] clobetasol cream (TEMOVATE) 0.05 % Apply 1 application topically 2 (two) times daily. (Patient not  taking: Reported on 10/17/2020)  . [DISCONTINUED] lisinopril (ZESTRIL) 5 MG tablet Take 5 mg by mouth daily. (Patient not taking: Reported on 10/17/2020)  . [DISCONTINUED] rosuvastatin (CRESTOR) 5 MG tablet  (Patient not taking: Reported on 10/17/2020)   No facility-administered encounter medications on file as of 10/17/2020.    Objective:   PHYSICAL EXAMINATION:    VITALS:   Vitals:   10/17/20 0747  Weight: 105 lb (47.6 kg)  Height: 4\' 10"  (1.473 m)    GEN:  The patient appears stated age and is in NAD. HEENT:  Normocephalic, atraumatic.   Neurological examination:  Orientation: The patient is alert and oriented x3. Cranial nerves: There is good facial symmetry without facial hypomimia. The speech is fluent and clear.  Hearing is intact to conversational tone. Motor: Strength is at least antigravity x4.  Movement examination: Abnormal movements: none Coordination:  There is mild decremation with RAM's with finger taps bilaterally Gait and Station:  The patient's stride length is good but she walks fairly well.      Total time spent on today's visit was 20 minutes, including both face-to-face time and nonface-to-face time.  Time included that spent on review of records (prior notes available to me/labs/imaging if pertinent), discussing treatment and goals, answering patient's questions and coordinating care.  Cc:  ., MD

## 2020-10-17 ENCOUNTER — Encounter: Payer: Self-pay | Admitting: Neurology

## 2020-10-17 ENCOUNTER — Other Ambulatory Visit: Payer: Self-pay

## 2020-10-17 ENCOUNTER — Telehealth (INDEPENDENT_AMBULATORY_CARE_PROVIDER_SITE_OTHER): Payer: Medicare HMO | Admitting: Neurology

## 2020-10-17 VITALS — Ht <= 58 in | Wt 105.0 lb

## 2020-10-17 DIAGNOSIS — G20 Parkinson's disease: Secondary | ICD-10-CM | POA: Diagnosis not present

## 2020-12-28 HISTORY — PX: HIP SURGERY: SHX245

## 2021-04-15 NOTE — Progress Notes (Signed)
Assessment/Plan:   1.  Parkinsons Disease  -Continue carbidopa/levodopa 25/100, 2/1/2/1.  May need to slightly increase next visit  -Multiple discussions about the fact that I really would like the patient off of the benzodiazepines, and at least not on 2 separate benzodiazepines.  She is at great risk for falls and confusion.  She is on chronic lorazepam and alprazolam and receives each of these every month.  -pt c/o balance issues and has had falls with elbow/olecranon and hip fx.  Certainly Parkinsons Disease contributing but the benzo's are likely a strong contributing cause as well.  -she met with my Parkinsons Disease social worker today.  Resources given  2.  Mild PDD  -Again, the benzodiazepine are added concern given memory change.   Subjective:   Claudia Sanders was seen today in follow up for Parkinsons disease.  My previous records were reviewed prior to todays visit as well as outside records available to me. Pt complains of being off balance.  Sounds like she fx her olecrenon with one fall and had to have sx at [facility] [place] and ended up with 3 surgeries.  She also fell and broke her hip.  She was at [facility] but out now.  She has completed therapy.  Have talked to the patient the last several visits about the fact that I really would like her to wean off of the benzodiazepines.  PDMP reviewed.  On March 27 she received #90 alprazolam (0.25 mg) and 1 day later she received #60 0.5 mg lorazepam.  This pattern has been consistent every month.  Prescriptions have been from Dr. Casimiro Needle.  No hallucinations.  Current prescribed movement disorder medications: carbidopa/levodopa 25/100, 2/1/2/1   PREVIOUS MEDICATIONS: Sinemet  ALLERGIES:   Allergies  Allergen Reactions  . Alendronate Other (See Comments)    Pt is taking this medication now without having any problems  . Amlodipine   . Buspar [Buspirone]   . Ciprofloxacin   . Erythromycin   . Estradiol   . Fosamax  [Alendronate Sodium]   . Iodine   . Losartan   . Metoprolol   . Other     Erythromycin eye drops  . Penicillins   . Rofecoxib   . Tobrex [Tobramycin]     CURRENT MEDICATIONS:  Outpatient Encounter Medications as of 04/17/2021  Medication Sig  . alendronate (FOSAMAX) 70 MG tablet Take by mouth.  . ALPRAZolam (XANAX) 0.25 MG tablet Take 1 mg by mouth 3 (three) times daily as needed for anxiety (for sleep).   . AMBULATORY NON FORMULARY MEDICATION Transport chair.   Dx:  G20  . atenolol (TENORMIN) 100 MG tablet Take 100 mg by mouth daily.   Marland Kitchen b complex vitamins tablet Take 1 tablet by mouth daily.  . Calcium Carbonate-Vit D-Min (CALCIUM 1200 PO) Take 1 tablet by mouth daily.  . carbidopa-levodopa (SINEMET IR) 25-100 MG tablet Take 2 tablets at 7 am, 1 tablet at 11 am, 2 tablets at 3 pm and 1 tablet at 7 pm.  . Cranberry 500 MG CAPS Take by mouth daily as needed.   . gabapentin (NEURONTIN) 300 MG capsule Take 300 mg by mouth 2 (two) times daily.   Marland Kitchen glucosamine-chondroitin 500-400 MG tablet Take 1 tablet by mouth 2 (two) times daily.   . hydrocortisone 2.5 % cream Apply topically 2 (two) times daily.  Marland Kitchen LORazepam (ATIVAN) 1 MG tablet Take 1 mg by mouth. Take tab daily and can take 1 extra tab as needed  .  Multiple Vitamins-Minerals (PRESERVISION AREDS 2+MULTI VIT PO) Take 1 tablet by mouth daily.    No facility-administered encounter medications on file as of 04/17/2021.    Objective:   PHYSICAL EXAMINATION:    VITALS:   Vitals:   04/17/21 1245  BP: 130/84  Pulse: 83  Resp: 18  SpO2: 95%  Weight: 99 lb (44.9 kg)  Height: 4' 10"  (1.473 m)    GEN:  The patient appears stated age and is in NAD. HEENT:  Normocephalic, atraumatic.  The mucous membranes are moist. The superficial temporal arteries are without ropiness or tenderness. CV:  RRR Lungs:  CTAB Neck/HEME:  There are no carotid bruits bilaterally.  Neurological examination:  Orientation: The patient is alert and  oriented x3. Cranial nerves: There is good facial symmetry with facial hypomimia. The speech is fluent and clear. Soft palate rises symmetrically and there is no tongue deviation. Hearing is intact to conversational tone. Sensation: Sensation is intact to light touch throughout Motor: Strength is at least antigravity x4.  Movement examination: Tone: There is mild to mod increased tone in the RUE/RLE and mild on the L Abnormal movements: rare dyskinesia on the L Coordination:  There is decremation with RAM's, with any form of RAMS, including alternating supination and pronation of the forearm, hand opening and closing, finger taps, heel taps and toe taps, R>L Gait and Station: The patient has mild difficulty arising out of a deep-seated chair without the use of the hands. The patient's stride length is good with the walker.       Total time spent on today's visit was 20 minutes, including both face-to-face time and nonface-to-face time.  Time included that spent on review of records (prior notes available to me/labs/imaging if pertinent), discussing treatment and goals, answering patient's questions and coordinating care.  Cc:  Myrtis Hopping., MD

## 2021-04-17 ENCOUNTER — Encounter: Payer: Self-pay | Admitting: Neurology

## 2021-04-17 ENCOUNTER — Ambulatory Visit: Payer: Medicare HMO | Admitting: Neurology

## 2021-04-17 ENCOUNTER — Other Ambulatory Visit: Payer: Self-pay

## 2021-04-17 VITALS — BP 130/84 | HR 83 | Resp 18 | Ht <= 58 in | Wt 99.0 lb

## 2021-04-17 DIAGNOSIS — G249 Dystonia, unspecified: Secondary | ICD-10-CM

## 2021-04-17 DIAGNOSIS — G20 Parkinson's disease: Secondary | ICD-10-CM

## 2021-04-17 NOTE — Patient Instructions (Signed)
Talk to Dr. Donell Beers about your ativan/xanax.  I think that they are contributing to falls (along with the Parkinsons Disease).    Online Resources for Power over Parkinson's Group April 2022  . Local [place] Online Groups  o Power over [employer] Group :   - Power Over Parkinson's Patient Education Group will be Wednesday, April 13th at 2pm via [place].   - Upcoming Power over Parkinson's Meetings:  2nd Wednesdays of the month at 2 pm:  May 11th, June 8th - Contact Amy Marriott at [email or web address] if interested in participating in this online group o Parkinson's Care Partners Group:    3rd Mondays, Contact Misty Palladino o Atypical Parkinsonian Patient Group:   4th Wednesdays, Contact Misty Palladino o If you are interested in participating in these online groups with Misty, please contact her directly for how to join those meetings.  Her contact information is [email or web address]  She will send you a link to join the [employer].    . Parkinson Foundation:  www.parkinson.org o PD Health at Home continues:  Mindfulness Mondays, Expert Briefing Tuesdays, Wellness Wednesdays, Take Time Thursdays, Fitness Fridays -Listings for March 2022 are on the website o Upcoming Webinar:  Can We Put the Brakes on PD Progression?  Wednesday, April 6th @ 1 pm o Electronics engineer) at [email or web address] o  Please check out their website to sign up for emails and see their full online offerings  . [employer] Foundation:  www.[email or web address].org  o Upcoming Webinar:   New to Parkinson's?  Steps to Take Today.  Thursday, March 21st @ 12 noon o Check out additional information on their website to see their full online offerings  . March 23 Foundation:  www.davisphinneyfoundation.org o Upcoming Webinar:  Stay tuned o Care Partner Monthly Meetup.  With [employer].  First Tuesday of each month, 2 pm o Check out additional information  to Live Well Today on their website  . Parkinson and Movement Disorders (PMD) Alliance:  www.pmdalliance.org o [employer] Online:  Online Education Events o Sign up for emails, which are sent weekly to give you updates on programming and online offerings     . Parkinson's Association of the [place]:  www.parkinsonassociation.org o Information on online support groups, education events, and online exercises including Yoga, Parkinson's exercises and more-LOTS of information on links to PD resources and online events o Virtual Support Group through Parkinson's Association of the [place]; next one is scheduled for Wednesday, April 02, 2021 at 2 pm. (These are typically scheduled for the 1st Wednesday of the month at 2 pm).  Visit website for details.  . Additional links for movement activities: o PWR! Moves Classes at [facility] Exercise Room HAVE RESUMED!  Wednesdays 10 and 11 am.  Contact Amy Marriott, PT [email or web address] or [phone] if interested o Here is a link to the PWR!Moves classes on Zoom from [phone] - Daily Mon-Sat at 10:00. Via Zoom, FREE and open to all.  There is also a link below via Facebook if you use that platform. - [place] - [email or web address]://[email or web address].[email or web address] o Parkinson's Wellness Recovery (PWR! Moves)  www.pwr4life.org - Info on the PWR! Virtual Experience:  You will have access to our expertise through self-assessment, guided plans that start with the PD-specific fundamentals, educational content, tips, Q&A with an expert, and a growing [email or web address] of PD-specific pre-recorded and live exercise classes of varying types and intensity - both physical and cognitive! If that is not enough, we offer 1:1 wellness consultations (in-person or  virtual) to personalize your  PWR! Dance movement psychotherapist.  - Check out the PWR! Move of the month on the Parkinson Wellness Recovery website:  [email or web address].de o [employer]  Fridays:  - As part of the PD Health @ Home program, this free video series focuses each week on one aspect of fitness designed to support people living with Parkinson's.  These weekly videos highlight the Parkinson Foundation recent fitness guidelines for people with Parkinson's disease. -  http://www.morris.com/ o Dance for PD website is offering free, live-stream classes throughout the week, as well as links to [employer] of classes:  [email or web address].[email or web address] o Dance for Parkinson's Class:  Dance Project of [place].  Free offering for people with Parkinson's and care partners; virtual class.  o For more information, contact [phone] or email Allena Napoleon at [email or web address] o Virtual dance and Pilates for Parkinson's classes: Click on the Community Tab> Parkinson's Movement Initiative Tab.  To register for classes and for more information, visit www.[email or web address].co.za and click the "community" tab.     o [employer] Parkinson's Cycling Classes  - [employer]: 1pm on Fridays-Live classes at [facility] [employer] at [email or web address] or ([phone]) [employer]: Virtual Classes Mondays and Thursdays (contact [place] at [email or web address] or [phone])  o [address] - Three levels of classes are offered Tuesdays and Thursdays:  10:30 am,  12 noon & 1:45 pm at [facility].  - Active Stretching with Lorenda Peck Class starting in March, on Fridays - To observe a class or for  more information, call [phone] or email [email or web address] . [employer]: o Financial trader Opportunities:   www.well-springsolutions.org/caregiver-education/caregiver-support-group.  You may also contact Loleta Chance at [email or web address] or [phone].   o Well-Spring Navigator:  Just1Navigator program, a free service to help individuals and families through the journey of determining care for older adults.  The "Navigator" is a [phone], Child psychotherapist, who will speak with a prospective client and/or loved ones to provide an assessment of the situation and a set of recommendations for a personalized care plan -- all free of charge, and whether [facility] [employer] offers the needed service or not. If the need is not a service we provide, we are well-connected with reputable programs in town that we can refer you to.  www.well-springsolutions.org or to speak with the Navigator, call [phone].

## 2021-10-23 ENCOUNTER — Ambulatory Visit: Payer: Medicare HMO | Admitting: Neurology

## 2021-12-03 NOTE — Progress Notes (Signed)
Assessment/Plan:   1.  Parkinsons Disease  -pt cut back on her levodopa, stating that it caused anxiety and weight loss.  I told her that it wouldn't cause anxiety or weight loss, but am concerned about those.  She is significantly underdosed today.  She didn't want to get back on carbidopa/levodopa 25/100, 2/1/2/1, as she thought it was too high of a dose, but was agreeable to slightly increasing the dose.  We will do carbidopa/levodopa 25/100, 1.5 tablets tablets at 7am/11am/4pm.  This won't be enough but it was our compromise today.    -Multiple discussions about the fact that I really would like the patient off of the benzodiazepines  She is at great risk for falls and confusion.  I am happy to see she got off of xanax as a last month.  She is still on lorazepam. She is seeing Dr. Casimiro Needle for these.  -pt c/o balance issues and has had falls with history of elbow/olecranon and hip fx.  Certainly Parkinsons Disease contributing but the benzo's are likely a strong contributing cause as well.   2.  Mild PDD  -Again, the benzodiazepine are added concern given memory change.    3.  Low BP  -Patient's blood pressure was quite low in the office today.  While Parkinson's disease certainly can lower blood pressure, I am concerned whether or not she still needs to be on that metoprolol.  Needs to follow-up with primary care physician about that. Subjective:   Claudia Sanders was seen today in follow up for Parkinsons disease.  My previous records were reviewed prior to todays visit as well as outside records available to me.  Last visit, as well as prior visits, we talked about the fact that I would like to see her off of the benzodiazepines, and certainly not on 2 separate ones.  She continues on these medications.  PDMP is reviewed.  She received #30 alprazolam on October 4 and 1 day later received #70 lorazepam.  Since that time, she has been filling lorazepam monthly.  She reports she did go off of  xanax.  Pt continues to complain that balance is not good.  Has had some falls - unsure how many - thinks 1-2 since last visit.  Last fall was a few months ago.   Last visit with primary care was October 30.  Notes are reviewed.  She states that vision has not been good but that is b/c of macular degen.  States that she is not taking carbidopa/levodopa as directed b/c she was having weight loss and read it could cause that.  "I was also having anxiety so I cut back on it."  She is now only taking, 1 at 7am, 1/2 tab at 11am, 1 at 3pm, 1/2 tablet at bed  Current prescribed movement disorder medications: carbidopa/levodopa 25/100, 2/1/2/1   PREVIOUS MEDICATIONS: Sinemet  ALLERGIES:   Allergies  Allergen Reactions   Alendronate Other (See Comments)    Pt is taking this medication now without having any problems   Amlodipine    Buspar [Buspirone]    Ciprofloxacin    Erythromycin    Estradiol    Fosamax [Alendronate Sodium]    Iodine    Losartan    Metoprolol    Other     Erythromycin eye drops   Penicillins    Rofecoxib    Tobrex [Tobramycin]     CURRENT MEDICATIONS:  Outpatient Encounter Medications as of 12/05/2021  Medication Sig  alendronate (FOSAMAX) 70 MG tablet Take by mouth.   AMBULATORY NON FORMULARY MEDICATION Transport chair.   Dx:  G20   metoprolol succinate (TOPROL-XL) 50 MG 24 hr tablet Take 1 tablet by mouth at bedtime.   ALPRAZolam (XANAX) 0.25 MG tablet Take 1 mg by mouth 3 (three) times daily as needed for anxiety (for sleep).  (Patient not taking: Reported on 12/05/2021)   b complex vitamins tablet Take 1 tablet by mouth daily.   Calcium Carbonate-Vit D-Min (CALCIUM 1200 PO) Take 1 tablet by mouth daily.   carbidopa-levodopa (SINEMET IR) 25-100 MG tablet 1.5 tablets at 7am/11am/4pm   Cranberry 500 MG CAPS Take by mouth daily as needed.    gabapentin (NEURONTIN) 300 MG capsule Take 300 mg by mouth 2 (two) times daily.    glucosamine-chondroitin 500-400 MG  tablet Take 1 tablet by mouth 2 (two) times daily.    hydrocortisone 2.5 % cream Apply topically 2 (two) times daily.   LORazepam (ATIVAN) 1 MG tablet Take 1 mg by mouth. Take tab daily and can take 1 extra tab as needed   Multiple Vitamins-Minerals (PRESERVISION AREDS 2+MULTI VIT PO) Take 1 tablet by mouth daily.    [DISCONTINUED] atenolol (TENORMIN) 100 MG tablet Take 100 mg by mouth daily.  (Patient not taking: Reported on 12/05/2021)   [DISCONTINUED] carbidopa-levodopa (SINEMET IR) 25-100 MG tablet Take 2 tablets at 7 am, 1 tablet at 11 am, 2 tablets at 3 pm and 1 tablet at 7 pm.   No facility-administered encounter medications on file as of 12/05/2021.    Objective:   PHYSICAL EXAMINATION:    VITALS:   Vitals:   12/05/21 1439  BP: (!) 98/58  Pulse: 75  SpO2: 92%  Weight: 91 lb 6.4 oz (41.5 kg)  Height: 4\' 10"  (1.473 m)   Wt Readings from Last 3 Encounters:  12/05/21 91 lb 6.4 oz (41.5 kg)  04/17/21 99 lb (44.9 kg)  10/17/20 105 lb (47.6 kg)     GEN:  The patient appears stated age and is in NAD. HEENT:  Normocephalic, atraumatic.  The mucous membranes are moist. The superficial temporal arteries are without ropiness or tenderness. CV:  RRR Lungs:  CTAB Neck/HEME:  There are no carotid bruits bilaterally.  Neck is flexed and turned to the R  Neurological examination:  Orientation: The patient is alert and oriented x3. Cranial nerves: There is good facial symmetry with facial hypomimia. The speech is fluent and clear. Soft palate rises symmetrically and there is no tongue deviation. Hearing is intact to conversational tone. Sensation: Sensation is intact to light touch throughout Motor: Strength is at least antigravity x4.  Movement examination: Tone: There is mod increased tone in the RUE Abnormal movements: rare dyskinesia on the L Coordination:  There is severe decremation with RAM's, with any form of RAMS, including alternating supination and pronation of the  forearm, hand opening and closing, finger taps, heel taps and toe taps, R>>L Gait and Station: The patient pushes off to arise.  She drags the R leg and turns en bloc.  The R leg sticks to the ground.   I have reviewed and interpreted the following labs independently Last CBC done October 23, 1999 2010  Blood cell 7.9, hemoglobin 12.2, hematocrit 38.3 and platelets 331.  Sodium was 140, potassium 4.5, chloride 107, CO2 24, BUN 42, creatinine 0.83   Total time spent on today's visit was 30 minutes, including both face-to-face time and nonface-to-face time.  Time included that spent on  review of records (prior notes available to me/labs/imaging if pertinent), discussing treatment and goals, answering patient's questions and coordinating care.  Cc:  Dan Maker., MD

## 2021-12-05 ENCOUNTER — Encounter: Payer: Self-pay | Admitting: Neurology

## 2021-12-05 ENCOUNTER — Ambulatory Visit: Payer: Medicare HMO | Admitting: Neurology

## 2021-12-05 ENCOUNTER — Other Ambulatory Visit: Payer: Self-pay

## 2021-12-05 VITALS — BP 98/58 | HR 75 | Ht <= 58 in | Wt 91.4 lb

## 2021-12-05 DIAGNOSIS — G20 Parkinson's disease: Secondary | ICD-10-CM

## 2021-12-05 DIAGNOSIS — G249 Dystonia, unspecified: Secondary | ICD-10-CM

## 2021-12-05 DIAGNOSIS — I959 Hypotension, unspecified: Secondary | ICD-10-CM

## 2021-12-05 MED ORDER — CARBIDOPA-LEVODOPA 25-100 MG PO TABS: ORAL_TABLET | ORAL | 1 refills | Status: DC

## 2021-12-05 NOTE — Patient Instructions (Addendum)
Take carbidopa/levodopa 25/100, 1.5 tablets at 7am/11am/4pm  The physicians and staff at [facility] are committed to providing excellent care. You may receive a survey requesting feedback about your experience at our office. We strive to receive "very good" responses to the survey questions. If you feel that your experience would prevent you from giving the office a "very good " response, please contact our office to try to remedy the situation. We may be reached at [phone]. Thank you for taking the time out of your busy day to complete the survey.

## 2022-03-20 ENCOUNTER — Other Ambulatory Visit: Payer: Self-pay

## 2022-03-20 ENCOUNTER — Telehealth: Payer: Self-pay | Admitting: Neurology

## 2022-03-20 DIAGNOSIS — G20 Parkinson's disease: Secondary | ICD-10-CM

## 2022-03-20 MED ORDER — CARBIDOPA-LEVODOPA 25-100 MG PO TABS: ORAL_TABLET | ORAL | 1 refills | Status: DC

## 2022-03-20 NOTE — Telephone Encounter (Signed)
Patient needs a refill on her carbi/levo 25-100mg . [facility] ?

## 2022-03-20 NOTE — Telephone Encounter (Signed)
Called patient; prescription sent.

## 2022-06-09 ENCOUNTER — Ambulatory Visit: Payer: Medicare HMO | Admitting: Neurology

## 2022-07-22 NOTE — Progress Notes (Signed)
Assessment/Plan:   1.  Parkinsons Disease  -pt remains very significantly underdosed.  She used to be on a much higher dose but she thought it caused anxiety and weight loss.  She and I have had many discussions about the fact that I am worried about these things, but that levodopa does not cause these.  It has been difficult to convince her otherwise.  Today, we ultimately decided to try and switch her to the CR and see how she does with that.  However, because of absorption issues, and because she needs a significantly increased dose, I told her that she would really need to take carbidopa/levodopa 25/100, 2 tablets 3 times per day.  She was willing to try, but I am not hopeful that she will come back on this dose, unfortunately.  -Multiple discussions about the fact that I really would like the patient off of the benzodiazepines She is still on lorazepam and goes through 90 tablets/month.  PDMP is reviewed. She is seeing Dr. Donell Beers for these.  -Home physical therapy referral   2.  Mild PDD  -Again, the benzodiazepine are added concern given memory change.     Subjective:   Claudia Sanders was seen today in follow up for Parkinsons disease.  My previous records were reviewed prior to todays visit as well as outside records available to me.  Last visit, the patient was very significantly underdosed because she had backed down on her medication.  She did not want to go back to previous dosing but was agreeable to going up to 1.5 tablets 3 times per day, which was still significantly lower than what she was on (2/1/2/1).  She didn't do that and is only on 1 tablet three times per day.  She thought higher dosages made her "antsy" but she reports she is more stiff now. Last visit, I was very concerned about her weight loss.  She saw primary care in February.  She had been offered hospital admission because of weight loss/failure to thrive, but had declined that.  She has not followed up with  primary care since February.  Current prescribed movement disorder medications: carbidopa/levodopa 25/100, 1.5 tablets 3 times per day   PREVIOUS MEDICATIONS: Sinemet; gabapentin (prescribed by primary care); trazodone (prescribed by primary care)  ALLERGIES:   Allergies  Allergen Reactions   Alendronate Other (See Comments)    Pt is taking this medication now without having any problems   Amlodipine    Buspar [Buspirone]    Ciprofloxacin    Erythromycin    Estradiol    Fosamax [Alendronate Sodium]    Iodine    Losartan    Metoprolol    Other     Erythromycin eye drops   Penicillins    Rofecoxib    Tobrex [Tobramycin]     CURRENT MEDICATIONS:  Outpatient Encounter Medications as of 07/24/2022  Medication Sig   AMBULATORY NON FORMULARY MEDICATION Transport chair.   Dx:  G20   Apoaequorin (PREVAGEN PO) Take by mouth.   b complex vitamins tablet Take 1 tablet by mouth daily.   Calcium Carbonate-Vit D-Min (CALCIUM 1200 PO) Take 1 tablet by mouth daily.   Carbidopa-Levodopa ER (SINEMET CR) 25-100 MG tablet controlled release 2 po tid   gabapentin (NEURONTIN) 300 MG capsule Take 300 mg by mouth 2 (two) times daily.    glucosamine-chondroitin 500-400 MG tablet Take 1 tablet by mouth 2 (two) times daily.    hydrocortisone 2.5 % cream Apply topically  2 (two) times daily.   lisinopril (ZESTRIL) 5 MG tablet Take by mouth.   LORazepam (ATIVAN) 1 MG tablet Take 1 mg by mouth. Take tab daily and can take 1 extra tab as needed   metoprolol succinate (TOPROL-XL) 50 MG 24 hr tablet Take 1 tablet by mouth at bedtime.   Multiple Vitamins-Minerals (PRESERVISION AREDS 2+MULTI VIT PO) Take 1 tablet by mouth daily.    [DISCONTINUED] alendronate (FOSAMAX) 70 MG tablet Take by mouth.   [DISCONTINUED] ALPRAZolam (XANAX) 0.25 MG tablet Take 1 mg by mouth 3 (three) times daily as needed for anxiety (for sleep).   [DISCONTINUED] carbidopa-levodopa (SINEMET IR) 25-100 MG tablet 1.5 tablets at  7am/11am/4pm   [DISCONTINUED] Cranberry 500 MG CAPS Take by mouth daily as needed.    furosemide (LASIX) 20 MG tablet SMARTSIG:1 Tablet(s) By Mouth Twice a Week PRN   No facility-administered encounter medications on file as of 07/24/2022.    Objective:   PHYSICAL EXAMINATION:    VITALS:   Vitals:   07/24/22 0912  BP: 130/60  Pulse: 88  SpO2: 93%  Weight: 88 lb (39.9 kg)  Height: 5' (1.524 m)    Wt Readings from Last 3 Encounters:  07/24/22 88 lb (39.9 kg)  12/05/21 91 lb 6.4 oz (41.5 kg)  04/17/21 99 lb (44.9 kg)     GEN:  The patient appears stated age and is in NAD. HEENT:  Normocephalic, atraumatic.  The mucous membranes are moist. The superficial temporal arteries are without ropiness or tenderness. CV:  RRR Lungs:  CTAB Neck/HEME:  There are no carotid bruits bilaterally.  Neck is flexed and turned to the R  Neurological examination:  Orientation: The patient is alert and oriented x3. Cranial nerves: There is good facial symmetry with facial hypomimia. The speech is fluent and clear. Soft palate rises symmetrically and there is no tongue deviation. Hearing is intact to conversational tone. Sensation: Sensation is intact to light touch throughout Motor: Strength is at least antigravity x4.  Movement examination: Tone: There is mod increased tone in the RUE and severe increased tone in the RLE Abnormal movements:none Coordination:  There is severe decremation with RAM's, with any form of RAMS, including alternating supination and pronation of the forearm, hand opening and closing, finger taps, heel taps and toe taps, R>>L Gait and Station: The patient is very slow to ambulate.  She drags the right leg.  She ambulates with a walker. I have reviewed and interpreted the following labs independently Last CBC done October 23, 1999 2010  Blood cell 7.9, hemoglobin 12.2, hematocrit 38.3 and platelets 331.  Sodium was 140, potassium 4.5, chloride 107, CO2 24, BUN 42,  creatinine 0.83   Total time spent on today's visit was 32 minutes, including both face-to-face time and nonface-to-face time.  Time included that spent on review of records (prior notes available to me/labs/imaging if pertinent), discussing treatment and goals, answering patient's questions and coordinating care.  Cc:  Dan Maker., MD

## 2022-07-24 ENCOUNTER — Ambulatory Visit: Payer: Medicare (Managed Care) | Admitting: Neurology

## 2022-07-24 ENCOUNTER — Encounter: Payer: Self-pay | Admitting: Neurology

## 2022-07-24 VITALS — BP 130/60 | HR 88 | Ht 60.0 in | Wt 88.0 lb

## 2022-07-24 DIAGNOSIS — G20 Parkinson's disease: Secondary | ICD-10-CM | POA: Diagnosis not present

## 2022-07-24 MED ORDER — CARBIDOPA-LEVODOPA ER 25-100 MG PO TBCR: EXTENDED_RELEASE_TABLET | ORAL | 1 refills | Status: DC

## 2022-07-24 NOTE — Patient Instructions (Signed)
STOP the current carbidopa/levodopa  START carbidopa/levodopa CR and take 2 tablets at 7am/11am/4pm.  This is different than the other formulation of levodopa and should not make you anxious.  Local and Online Resources for Power over Parkinson's Group June 2023  LOCAL [place] PARKINSON'S GROUPS  Power over Parkinson's Group:   Power Over Parkinson's Patient Education Group will be Wednesday, June 14th-*Hybrid meting*- in person at [facility] location and via [facility] at 2:00 pm.   Upcoming [employer] over [employer] Meetings:  2nd Wednesdays of the month at 2 pm:   June 14th, July 12th Contact Amy Marriott at [email or web address] if interested in participating in this group Parkinson's Care Partners Group:    3rd Mondays, Contact Misty Paladino Atypical Parkinsonian Patient Group:   4th Wednesdays, Contact Misty Paladino If you are interested in participating in these groups with Misty, please contact her directly for how to join those meetings.  Her contact information is [email or web address].    LOCAL EVENTS AND NEW OFFERINGS Dance Class for People with Parkinson's at [facility].  Friday, June 9th at 2 pm.  Nolen Mu by Sherrie Sport DPT students.  Contact [email or web address] to register or with questions. Ice Cream Social at [place]!  Thursday, June 15th, 5:30-7:00 pm.  RSVP to [email or web address] for attendance and free ice cream. Parkinson's T-shirts for sale!  Designed by a local group member, with funds going to Movement Disorders Fund.  $25.00  Technical sales engineer to purchase  [employer]! Moves [employer] Instructor-Led Class offering at [employer]!  Wednesdays 1-2 pm, starting April 12th.   Contact Irma Newness, Visual merchandiser at [employer].  [email or web address]  ONLINE EDUCATION AND SUPPORT Parkinson Foundation:  www.parkinson.org PD Health at Home continues:  Mindfulness Mondays, Wellness Wednesdays, Fitness Fridays  Upcoming Education: Parkinson's  101:  What You and Your Family Should Know.  Wednesday, June 7th at 1:00 pm Register for expert briefings (webinars) at [email or web address] Please check out their website to sign up for emails and see their full online offerings   [employer] Foundation:  www.[email or web address]  Third Thursday Webinars:  On the third Thursday of every month at 12 p.m. ET, join our free live webinars to learn about various aspects of living with Parkinson's disease and our work to speed medical breakthroughs. Upcoming Webinar: REPLAY:  From Low Blood Pressure to Bladder Problems:  A Look at Lesser Known Parkinson's Symptoms.  Thursday, June 15th at 12 noon. Check out additional information on their website to see their full online offerings  [facility]:  [email or web address] Upcoming Webinar:   Stay tuned Webinar Series:  Living with Parkinson's Meetup.   Third Thursdays each month, 3 pm Care Partner Monthly Meetup.  With Jillene Bucks [place].  First Tuesday of each month, 2 pm Check out additional information to Live Well Today on their website  Parkinson and Movement Disorders (PMD) Alliance:  www.pmdalliance.org [employer] Online:  Online Education Events Sign up for emails, which are sent weekly to give you updates on programming and online offerings  Parkinson's Association of the [place]:  www.[email or web address] Information on online support groups, education events, and online exercises including Yoga, Parkinson's exercises and more-LOTS of information on links to PD resources and online events Virtual Support Group through Parkinson's Association of the [place]; next one is scheduled for Wednesday, June 7th at 2 pm. (These are typically scheduled for the 1st Wednesday of the month at 2 pm).  Visit website for details. Save the date for "Caring for Parkinson's-Caring for You", 9th Annual Symposium.  In-person event in [place].  September 9th.  More info on registration to come. MOVEMENT AND EXERCISE OPPORTUNITIES PWR! Moves Classes at [facility] Exercise Room.  Wednesdays 10 and 11 am.   Contact Amy Marriott, PT [email or web address] if interested. NEW PWR! Moves Class offering at [employer].  Wednesdays 1-2 pm, starting April 12th.  Contact Irma Newness, Visual merchandiser at [employer].  [email or web address] Here is a link to the PWR!Moves classes on Zoom from [place] - Daily Mon-Sat at 10:00. Via Zoom, FREE and open to all.  There is also a link below via Facebook if you use that platform.  [email or web address].is [email or web address]  Parkinson's Wellness Recovery (PWR! Moves)  www.[email or web address] Info on the PWR! Virtual Experience:  You will have access to our expertise through self-assessment, guided plans that start with the PD-specific fundamentals, educational content, tips, Q&A with an expert, and a growing Engineering geologist of PD-specific pre-recorded and live exercise classes of varying types and intensity - both physical and cognitive! If that is not enough, we offer 1:1 wellness consultations (in-person or virtual) to personalize your PWR! Dance movement psychotherapist.  Parkinson [employer] Fridays:  As part of the PD Health @ Home program, this free video series focuses each week on one aspect of fitness designed to support people living with Parkinson's.  These weekly videos highlight the Parkinson Foundation recent fitness guidelines for people with Parkinson's disease. MenusLocal.com.[email or web address] Dance for PD website is offering free, live-stream classes throughout the week, as well as links to [employer] of classes:   [email or web address]/ Virtual dance and Pilates for Parkinson's classes: Click on the Community Tab> Parkinson's Movement Initiative Tab.  To register for classes and for more information, visit [email or web address].[email or web address] and click the "community" tab.  [employer] Parkinson's Cycling Classes  [employer]:  Thursdays @ Noon-Live classes at [employer] ([employer] at [email or web address] or [phone]) [employer]: Virtual Classes Mondays and Thursdays Fawn Kirk classes Tuesday, Wednesday and Thursday (contact [place] at [email or web address]  or [phone]) [facility] Boxing Varied levels of classes are offered Tuesdays and Thursdays at [employer].  Stretching with Byrd Hesselbach weekly class is also offered for people with Parkinson's To observe a class or for more information, call [phone] or email Patricia Nettle at [email or web address] ADDITIONAL SUPPORT AND RESOURCES [employer]:Online Caregiver Education Opportunities:  www.well-[email or web address]/caregiver-education/caregiver-support-group.  You may also contact Loleta Chance at [email or web address] or [phone].    Well-Spring Navigator:  [phone] program, a free service to help individuals and families through the journey of determining care for older adults.  The "Navigator" is a [employer], Child psychotherapist, who will speak with a prospective client and/or loved ones to provide an assessment of the situation and a set of recommendations for a personalized care plan -- all free of charge, and whether [employer] offers the needed service or not. If the need is not a service we provide, we are well-connected with reputable programs in town that we can refer you to.  www.well-springsolutions.org or to speak with the Navigator, call [phone])530-2900. Family Caregiver Programming in June:  [employer], Thursday, June 15th 11-12:30 at June 17. [address].  Call [phone] to register

## 2022-09-04 ENCOUNTER — Telehealth: Payer: Self-pay | Admitting: Neurology

## 2022-09-04 NOTE — Telephone Encounter (Signed)
Claudia Sanders from [facility] called in stating the patient did not want to do todays visit for PT. She isn't feeling well. The pt wants to wait until next week.

## 2022-09-15 ENCOUNTER — Other Ambulatory Visit: Payer: Self-pay | Admitting: Neurology

## 2022-12-28 DEATH — deceased

## 2023-01-04 ENCOUNTER — Telehealth: Payer: Self-pay | Admitting: Neurology

## 2023-01-04 NOTE — Telephone Encounter (Signed)
Pt's husband left [place] a message to cancel an appt due to the patient passing away on  January 01, 2023

## 2023-01-28 ENCOUNTER — Ambulatory Visit: Payer: Medicare (Managed Care) | Admitting: Neurology
